# Patient Record
Sex: Female | Born: 1935 | Hispanic: No | Smoking: Former smoker
Health system: Southern US, Community
[De-identification: ages and names within clinical notes are randomized; demographics above are authoritative.]

## PROBLEM LIST (undated history)

## (undated) DIAGNOSIS — I219 Acute myocardial infarction, unspecified: Secondary | ICD-10-CM

## (undated) DIAGNOSIS — E119 Type 2 diabetes mellitus without complications: Secondary | ICD-10-CM

## (undated) DIAGNOSIS — I200 Unstable angina: Secondary | ICD-10-CM

## (undated) DIAGNOSIS — I10 Essential (primary) hypertension: Secondary | ICD-10-CM

## (undated) HISTORY — PX: CAROTID STENT INSERTION: SHX5766

---

## 1997-11-28 ENCOUNTER — Other Ambulatory Visit: Admission: RE | Admit: 1997-11-28 | Discharge: 1997-11-28 | Payer: Self-pay | Admitting: Family Medicine

## 1999-01-09 ENCOUNTER — Other Ambulatory Visit: Admission: RE | Admit: 1999-01-09 | Discharge: 1999-01-09 | Payer: Self-pay | Admitting: Family Medicine

## 2001-10-31 ENCOUNTER — Other Ambulatory Visit: Admission: RE | Admit: 2001-10-31 | Discharge: 2001-10-31 | Payer: Self-pay | Admitting: Family Medicine

## 2006-03-26 ENCOUNTER — Other Ambulatory Visit: Admission: RE | Admit: 2006-03-26 | Discharge: 2006-03-26 | Payer: Self-pay | Admitting: Family Medicine

## 2007-06-23 DIAGNOSIS — I219 Acute myocardial infarction, unspecified: Secondary | ICD-10-CM

## 2007-06-23 HISTORY — DX: Acute myocardial infarction, unspecified: I21.9

## 2008-02-08 ENCOUNTER — Other Ambulatory Visit: Admission: RE | Admit: 2008-02-08 | Discharge: 2008-02-08 | Payer: Self-pay | Admitting: Family Medicine

## 2013-06-01 ENCOUNTER — Ambulatory Visit: Payer: Self-pay

## 2013-12-12 ENCOUNTER — Encounter (HOSPITAL_COMMUNITY)
Admission: EM | Disposition: A | Discharge status: Home / Self Care | Payer: Self-pay | Source: Home / Self Care | Attending: Emergency Medicine

## 2013-12-12 ENCOUNTER — Encounter (HOSPITAL_COMMUNITY): Payer: Self-pay | Admitting: Emergency Medicine

## 2013-12-12 ENCOUNTER — Other Ambulatory Visit: Payer: Self-pay

## 2013-12-12 ENCOUNTER — Emergency Department (HOSPITAL_COMMUNITY): Payer: Medicare Other

## 2013-12-12 ENCOUNTER — Observation Stay (HOSPITAL_COMMUNITY)
Admission: EM | Admit: 2013-12-12 | Discharge: 2013-12-12 | Disposition: A | Discharge status: Home / Self Care | Payer: Medicare Other | Attending: Internal Medicine | Admitting: Internal Medicine

## 2013-12-12 DIAGNOSIS — Z87891 Personal history of nicotine dependence: Secondary | ICD-10-CM | POA: Insufficient documentation

## 2013-12-12 DIAGNOSIS — I200 Unstable angina: Secondary | ICD-10-CM | POA: Diagnosis present

## 2013-12-12 DIAGNOSIS — R079 Chest pain, unspecified: Secondary | ICD-10-CM

## 2013-12-12 DIAGNOSIS — I951 Orthostatic hypotension: Secondary | ICD-10-CM | POA: Diagnosis present

## 2013-12-12 DIAGNOSIS — I2510 Atherosclerotic heart disease of native coronary artery without angina pectoris: Secondary | ICD-10-CM | POA: Diagnosis present

## 2013-12-12 DIAGNOSIS — E785 Hyperlipidemia, unspecified: Secondary | ICD-10-CM | POA: Diagnosis present

## 2013-12-12 DIAGNOSIS — D649 Anemia, unspecified: Secondary | ICD-10-CM

## 2013-12-12 DIAGNOSIS — N390 Urinary tract infection, site not specified: Secondary | ICD-10-CM

## 2013-12-12 DIAGNOSIS — I252 Old myocardial infarction: Secondary | ICD-10-CM | POA: Insufficient documentation

## 2013-12-12 DIAGNOSIS — I25119 Atherosclerotic heart disease of native coronary artery with unspecified angina pectoris: Secondary | ICD-10-CM

## 2013-12-12 DIAGNOSIS — E119 Type 2 diabetes mellitus without complications: Secondary | ICD-10-CM | POA: Insufficient documentation

## 2013-12-12 DIAGNOSIS — Z79899 Other long term (current) drug therapy: Secondary | ICD-10-CM | POA: Insufficient documentation

## 2013-12-12 DIAGNOSIS — R55 Syncope and collapse: Principal | ICD-10-CM

## 2013-12-12 DIAGNOSIS — I10 Essential (primary) hypertension: Secondary | ICD-10-CM | POA: Diagnosis present

## 2013-12-12 DIAGNOSIS — I209 Angina pectoris, unspecified: Secondary | ICD-10-CM

## 2013-12-12 DIAGNOSIS — Z7902 Long term (current) use of antithrombotics/antiplatelets: Secondary | ICD-10-CM | POA: Insufficient documentation

## 2013-12-12 HISTORY — DX: Unstable angina: I20.0

## 2013-12-12 HISTORY — DX: Type 2 diabetes mellitus without complications: E11.9

## 2013-12-12 HISTORY — PX: FRACTIONAL FLOW RESERVE WIRE: SHX5839

## 2013-12-12 HISTORY — PX: LEFT HEART CATHETERIZATION WITH CORONARY ANGIOGRAM: SHX5451

## 2013-12-12 HISTORY — DX: Acute myocardial infarction, unspecified: I21.9

## 2013-12-12 HISTORY — DX: Essential (primary) hypertension: I10

## 2013-12-12 LAB — GLUCOSE, CAPILLARY
GLUCOSE-CAPILLARY: 218 mg/dL — AB (ref 70–99)
Glucose-Capillary: 156 mg/dL — ABNORMAL HIGH (ref 70–99)
Glucose-Capillary: 157 mg/dL — ABNORMAL HIGH (ref 70–99)

## 2013-12-12 LAB — CBC
HCT: 35.6 % — ABNORMAL LOW (ref 36.0–46.0)
HEMATOCRIT: 34.9 % — AB (ref 36.0–46.0)
HEMOGLOBIN: 11 g/dL — AB (ref 12.0–15.0)
Hemoglobin: 11.4 g/dL — ABNORMAL LOW (ref 12.0–15.0)
MCH: 28.2 pg (ref 26.0–34.0)
MCH: 27.8 pg (ref 26.0–34.0)
MCHC: 32 g/dL (ref 30.0–36.0)
MCHC: 31.5 g/dL (ref 30.0–36.0)
MCV: 88.1 fL (ref 78.0–100.0)
MCV: 88.1 fL (ref 78.0–100.0)
Platelets: 273 10*3/uL (ref 150–400)
Platelets: 253 10*3/uL (ref 150–400)
RBC: 4.04 MIL/uL (ref 3.87–5.11)
RBC: 3.96 MIL/uL (ref 3.87–5.11)
RDW: 13.7 % (ref 11.5–15.5)
RDW: 13.6 % (ref 11.5–15.5)
WBC: 5.2 10*3/uL (ref 4.0–10.5)
WBC: 5.3 10*3/uL (ref 4.0–10.5)

## 2013-12-12 LAB — TROPONIN I: Troponin I: <0.3 ng/mL (ref <0.30)

## 2013-12-12 LAB — HEMOGLOBIN A1C
Hgb A1c MFr Bld: 7.8 % — ABNORMAL HIGH (ref <5.7)
Mean Plasma Glucose: 177 mg/dL — ABNORMAL HIGH (ref <117)

## 2013-12-12 LAB — URINALYSIS W MICROSCOPIC (NOT AT ARMC)
BILIRUBIN URINE: NEGATIVE
Glucose, UA: NEGATIVE mg/dL
Hgb urine dipstick: NEGATIVE
KETONES UR: NEGATIVE mg/dL
NITRITE: NEGATIVE
PH: 5.5 (ref 5.0–8.0)
PROTEIN: NEGATIVE mg/dL
Specific Gravity, Urine: 1.016 (ref 1.005–1.030)
Urobilinogen, UA: 0.2 mg/dL (ref 0.0–1.0)

## 2013-12-12 LAB — BASIC METABOLIC PANEL
BUN: 15 mg/dL (ref 6–23)
CALCIUM: 9.7 mg/dL (ref 8.4–10.5)
CO2: 24 mEq/L (ref 19–32)
Chloride: 105 mEq/L (ref 96–112)
Creatinine, Ser: 0.9 mg/dL (ref 0.50–1.10)
GFR, EST AFRICAN AMERICAN: 70 mL/min — AB (ref >90)
GFR, EST NON AFRICAN AMERICAN: 61 mL/min — AB (ref >90)
Glucose, Bld: 155 mg/dL — ABNORMAL HIGH (ref 70–99)
POTASSIUM: 4.2 meq/L (ref 3.7–5.3)
Sodium: 138 mEq/L (ref 137–147)

## 2013-12-12 LAB — POCT ACTIVATED CLOTTING TIME
Activated Clotting Time: 219 seconds
Activated Clotting Time: 259 seconds

## 2013-12-12 LAB — I-STAT CG4 LACTIC ACID, ED: Lactic Acid, Venous: 1.34 mmol/L (ref 0.5–2.2)

## 2013-12-12 LAB — I-STAT TROPONIN, ED: Troponin i, poc: 0.01 ng/mL (ref 0.00–0.08)

## 2013-12-12 LAB — PROTIME-INR
INR: 1.04 (ref 0.00–1.49)
Prothrombin Time: 13.4 seconds (ref 11.6–15.2)

## 2013-12-12 LAB — DIGOXIN LEVEL: DIGOXIN LVL: 1.1 ng/mL (ref 0.8–2.0)

## 2013-12-12 SURGERY — LEFT HEART CATHETERIZATION WITH CORONARY ANGIOGRAM
Anesthesia: LOCAL

## 2013-12-12 MED ORDER — ROSUVASTATIN CALCIUM 20 MG PO TABS: 20.0000 mg | ORAL_TABLET | Freq: Every day | ORAL | Status: AC

## 2013-12-12 MED ORDER — MIDAZOLAM HCL 2 MG/2ML IJ SOLN
INTRAMUSCULAR | Status: AC
  Filled 2013-12-12: qty 2

## 2013-12-12 MED ORDER — ATORVASTATIN CALCIUM 40 MG PO TABS
40.0000 mg | ORAL_TABLET | Freq: Every day | ORAL | Status: DC
  Filled 2013-12-12: qty 1

## 2013-12-12 MED ORDER — SODIUM CHLORIDE 0.9 % IV BOLUS (SEPSIS)
500.0000 mL | Freq: Once | INTRAVENOUS | Status: AC
  Administered 2013-12-12: 500 mL via INTRAVENOUS

## 2013-12-12 MED ORDER — NITROGLYCERIN 0.4 MG SL SUBL: 0.4000 mg | SUBLINGUAL_TABLET | SUBLINGUAL | Status: DC | PRN

## 2013-12-12 MED ORDER — ISOSORBIDE MONONITRATE ER 30 MG PO TB24: 30.0000 mg | ORAL_TABLET | Freq: Every day | ORAL | Status: DC

## 2013-12-12 MED ORDER — ENOXAPARIN SODIUM 40 MG/0.4ML ~~LOC~~ SOLN
40.0000 mg | SUBCUTANEOUS | Status: DC
Start: 2013-12-12 — End: 2013-12-12
  Filled 2013-12-12 (×2): qty 0.4

## 2013-12-12 MED ORDER — MORPHINE SULFATE 2 MG/ML IJ SOLN: 2.0000 mg | INTRAMUSCULAR | Status: DC | PRN

## 2013-12-12 MED ORDER — ACETAMINOPHEN 325 MG PO TABS: 650.0000 mg | ORAL_TABLET | ORAL | Status: DC | PRN

## 2013-12-12 MED ORDER — ADENOSINE 12 MG/4ML IV SOLN
12.0000 mL | INTRAVENOUS | Status: AC
  Administered 2013-12-12: 36 mg via INTRAVENOUS
  Filled 2013-12-12: qty 12

## 2013-12-12 MED ORDER — SODIUM CHLORIDE 0.9 % IV SOLN
1.0000 mL/kg/h | INTRAVENOUS | Status: AC
  Administered 2013-12-12: 1 mL/kg/h via INTRAVENOUS

## 2013-12-12 MED ORDER — ONDANSETRON HCL 4 MG/2ML IJ SOLN: 4.0000 mg | Freq: Four times a day (QID) | INTRAMUSCULAR | Status: DC | PRN

## 2013-12-12 MED ORDER — CARVEDILOL 25 MG PO TABS
25.0000 mg | ORAL_TABLET | Freq: Two times a day (BID) | ORAL | Status: DC
  Filled 2013-12-12 (×3): qty 1

## 2013-12-12 MED ORDER — SODIUM CHLORIDE 0.45 % IV SOLN
INTRAVENOUS | Status: DC
  Administered 2013-12-12: 1000 mL via INTRAVENOUS

## 2013-12-12 MED ORDER — DIGOXIN 125 MCG PO TABS
0.1250 mg | ORAL_TABLET | Freq: Every day | ORAL | Status: DC
  Administered 2013-12-12: 0.125 mg via ORAL
  Filled 2013-12-12: qty 1

## 2013-12-12 MED ORDER — LIDOCAINE HCL (PF) 1 % IJ SOLN
INTRAMUSCULAR | Status: AC
  Filled 2013-12-12: qty 30

## 2013-12-12 MED ORDER — DEXTROSE 5 % IV SOLN
1.0000 g | INTRAVENOUS | Status: DC
  Administered 2013-12-12: 1 g via INTRAVENOUS
  Filled 2013-12-12: qty 10

## 2013-12-12 MED ORDER — SULFAMETHOXAZOLE-TMP DS 800-160 MG PO TABS: 1.0000 | ORAL_TABLET | Freq: Two times a day (BID) | ORAL | Status: DC

## 2013-12-12 MED ORDER — LEVOTHYROXINE SODIUM 100 MCG PO TABS
100.0000 ug | ORAL_TABLET | Freq: Every day | ORAL | Status: DC
  Administered 2013-12-12: 100 ug via ORAL
  Filled 2013-12-12 (×2): qty 1

## 2013-12-12 MED ORDER — ATORVASTATIN CALCIUM 20 MG PO TABS
20.0000 mg | ORAL_TABLET | Freq: Every day | ORAL | Status: DC
  Filled 2013-12-12: qty 1

## 2013-12-12 MED ORDER — GI COCKTAIL ~~LOC~~: 30.0000 mL | Freq: Four times a day (QID) | ORAL | Status: DC | PRN

## 2013-12-12 MED ORDER — HEPARIN SODIUM (PORCINE) 1000 UNIT/ML IJ SOLN
INTRAMUSCULAR | Status: AC
  Filled 2013-12-12: qty 1

## 2013-12-12 MED ORDER — NITROGLYCERIN 0.2 MG/ML ON CALL CATH LAB
INTRAVENOUS | Status: AC
  Filled 2013-12-12: qty 1

## 2013-12-12 MED ORDER — FENTANYL CITRATE 0.05 MG/ML IJ SOLN
INTRAMUSCULAR | Status: AC
  Filled 2013-12-12: qty 2

## 2013-12-12 MED ORDER — VERAPAMIL HCL 2.5 MG/ML IV SOLN
INTRAVENOUS | Status: AC
  Filled 2013-12-12: qty 2

## 2013-12-12 MED ORDER — INSULIN ASPART 100 UNIT/ML ~~LOC~~ SOLN
0.0000 [IU] | Freq: Four times a day (QID) | SUBCUTANEOUS | Status: DC
  Administered 2013-12-12 (×2): 2 [IU] via SUBCUTANEOUS
  Administered 2013-12-12: 3 [IU] via SUBCUTANEOUS

## 2013-12-12 MED ORDER — ISOSORBIDE MONONITRATE ER 30 MG PO TB24
30.0000 mg | ORAL_TABLET | Freq: Every day | ORAL | Status: DC
  Administered 2013-12-12: 30 mg via ORAL
  Filled 2013-12-12: qty 1

## 2013-12-12 MED ORDER — ASPIRIN 81 MG PO CHEW
81.0000 mg | CHEWABLE_TABLET | ORAL | Status: AC
Start: 2013-12-12 — End: 2013-12-12
  Administered 2013-12-12: 81 mg via ORAL
  Filled 2013-12-12: qty 1

## 2013-12-12 MED ORDER — HEPARIN (PORCINE) IN NACL 2-0.9 UNIT/ML-% IJ SOLN
INTRAMUSCULAR | Status: AC
  Filled 2013-12-12: qty 500

## 2013-12-12 MED ORDER — CLOPIDOGREL BISULFATE 75 MG PO TABS
75.0000 mg | ORAL_TABLET | Freq: Every day | ORAL | Status: DC
Start: 2013-12-12 — End: 2013-12-12
  Administered 2013-12-12: 75 mg via ORAL
  Filled 2013-12-12: qty 1

## 2013-12-12 MED ORDER — HEPARIN (PORCINE) IN NACL 2-0.9 UNIT/ML-% IJ SOLN
INTRAMUSCULAR | Status: AC
  Filled 2013-12-12: qty 1000

## 2013-12-12 MED ORDER — INSULIN ASPART 100 UNIT/ML ~~LOC~~ SOLN: 0.0000 [IU] | Freq: Every day | SUBCUTANEOUS | Status: DC

## 2013-12-12 NOTE — ED Provider Notes (Signed)
CSN: [id number]     Arrival date & time 12/12/13  0049 History   First MD Initiated Contact with Patient 12/12/13 0125     Chief Complaint  Patient presents with  . Chest Pain     (Consider location/radiation/quality/duration/timing/severity/associated sxs/prior Treatment) Patient is a 78 y.o. female presenting with syncope. The history is provided by the patient.  Loss of Consciousness Episode history:  Single Most recent episode:  Today Timing:  Constant Progression:  Resolved Chronicity:  New Context: standing up (patient was standing making a sandwich and became dizzy)   Context: not dehydration, not exertion and not urination   Witnessed: no   Relieved by:  Nothing Worsened by:  Nothing tried Ineffective treatments:  None tried Associated symptoms: chest pain (daily, for which she takes nitroglycerin)   Associated symptoms: no fever, no shortness of breath and no vomiting     Past Medical History  Diagnosis Date  . Hypertension   . Diabetes mellitus without complication   . MI (myocardial infarction)    Past Surgical History  Procedure Laterality Date  . Carotid stent insertion     No family history on file. History  Substance Use Topics  . Smoking status: Former Smoker    Types: Cigarettes    Quit date: 06/23/2007  . Smokeless tobacco: Not on file  . Alcohol Use: No   OB History   Grav Para Term Preterm Abortions TAB SAB Ect Mult Living                 Review of Systems  Constitutional: Negative for fever.  Respiratory: Negative for shortness of breath.   Cardiovascular: Positive for chest pain (daily, for which she takes nitroglycerin) and syncope.  Gastrointestinal: Negative for vomiting.  All other systems reviewed and are negative.     Allergies  Review of patient's allergies indicates no known allergies.  Home Medications   Prior to Admission medications   Medication Sig Start Date End Date Taking? Authorizing Provider  carvedilol (COREG)  25 MG tablet Take 25 mg by mouth 2 (two) times daily with a meal.   Yes Historical Provider, MD  clopidogrel (PLAVIX) 75 MG tablet Take 75 mg by mouth daily with breakfast.   Yes Historical Provider, MD  digoxin (LANOXIN) 0.125 MG tablet Take 0.125 mg by mouth daily.   Yes Historical Provider, MD  glimepiride (AMARYL) 4 MG tablet Take 4 mg by mouth 2 (two) times daily.   Yes Historical Provider, MD  levothyroxine (SYNTHROID, LEVOTHROID) 100 MCG tablet Take 100 mcg by mouth daily before breakfast.   Yes Historical Provider, MD  lisinopril (PRINIVIL,ZESTRIL) 5 MG tablet Take 5 mg by mouth daily.   Yes Historical Provider, MD  metFORMIN (GLUCOPHAGE) 1000 MG tablet Take 1,000 mg by mouth 2 (two) times daily with a meal.   Yes Historical Provider, MD  nitroGLYCERIN (NITROSTAT) 0.4 MG SL tablet Place 0.4 mg under the tongue every 5 (five) minutes as needed for chest pain.   Yes Historical Provider, MD  rosuvastatin (CRESTOR) 5 MG tablet Take 5 mg by mouth daily.   Yes Historical Provider, MD  Vitamin D, Ergocalciferol, (DRISDOL) 50000 UNITS CAPS capsule Take 50,000 Units by mouth every 7 (seven) days. On Sunday   Yes Historical Provider, MD   BP 135/60  Pulse 65  Temp(Src) 97.6 F (36.4 C) (Oral)  Resp 15  SpO2 98% Physical Exam  Nursing note and vitals reviewed. Constitutional: She is oriented to person, place, and time. She appears  well-developed and well-nourished. No distress.  HENT:  Head: Normocephalic and atraumatic.  Eyes: EOM are normal. Pupils are equal, round, and reactive to light.  Neck: Normal range of motion. Neck supple.  Cardiovascular: Normal rate and regular rhythm.  Exam reveals no friction rub.   No murmur heard. Pulmonary/Chest: Effort normal and breath sounds normal. No respiratory distress. She has no wheezes. She has no rales.  Abdominal: Soft. She exhibits no distension. There is no tenderness. There is no rebound.  Musculoskeletal: Normal range of motion. She exhibits  no edema.  Neurological: She is alert and oriented to person, place, and time.  Skin: She is not diaphoretic.    ED Course  Procedures (including critical care time) Labs Review Labs Reviewed  CBC - Abnormal; Notable for the following:    Hemoglobin 11.4 (*)    HCT 35.6 (*)    All other components within normal limits  BASIC METABOLIC PANEL - Abnormal; Notable for the following:    Glucose, Bld 155 (*)    GFR calc non Af Amer 61 (*)    GFR calc Af Amer 70 (*)    All other components within normal limits  DIGOXIN LEVEL  I-STAT TROPOININ, ED  I-STAT CG4 LACTIC ACID, ED    Imaging Review Dg Chest Port 1 View  12/12/2013   CLINICAL DATA:  Chest pain. Shortness of breath. Hypertension. Diabetes.  EXAM: PORTABLE CHEST - 1 VIEW  COMPARISON:  11/18/2011  FINDINGS: Mild cardiomegaly.  Tortuous thoracic aorta.  Thoracic spondylosis.  No edema. The lungs appear clear. The patient is rotated to the Right on today's radiograph, reducing diagnostic sensitivity and specificity.  IMPRESSION: 1. Mild cardiomegaly, but without edema. 2. Thoracic spondylosis.   Electronically Signed   By: Sherryl Barters M.D.   On: 12/12/2013 01:38     EKG Interpretation   Date/Time:  Tuesday December 12 2013 00:57:26 EDT Ventricular Rate:  59 PR Interval:  171 QRS Duration: 121 QT Interval:  398 QTC Calculation: 394 R Axis:   -7 Text Interpretation:  Sinus rhythm Ventricular premature complex Right  bundle branch block Anterior infarct, age indeterminate No prior Confirmed  by Mingo Amber  MD, Shaneka Efaw ([id number]) on 12/12/2013 1:27:19 AM      MDM   Final diagnoses:  Syncope, unspecified syncope type    79 year old female with history of hypertension, diabetes, MI presents with dizziness and syncope. Patient has chronic chest pain takes nitroglycerin every day. Patient took a nitroglycerin today, he came weak, and passed out. Our department initially and blood pressure 80/40, EMS had stable pressure. Patient still  feels weak today. Patient informed me she was dizzy while making a sandwich and then passed out. She did not report any preceding chest pain, shortness of breath. Here vitals are stable. Neurologically intact. Labs are okay. Admitted for her syncope.    Osvaldo Shipper, MD 12/12/13 [phone]

## 2013-12-12 NOTE — Discharge Summary (Signed)
Physician Discharge Summary  Dorothy Pena MCE:[id number] DOB: [birth year] DOA: 12/12/2013  PCP: [name], MD  Admit date: 12/12/2013 Discharge date: 12/12/2013  Discharged at patient's request due to death in the family.   Recommendations for Outpatient Follow-up:  1. Start bactrim for UTI 2. Start imdur and increased atorvastatin 3. Hemoglobin a1c is pending 4. F/u with Dr. Geraldo Pitter cardiology, call his office tomorrow to schedule appointment for further evaluation 5. Encouraged to stay avoid the heat and drink 1.5-2L of water 6. PCP in 2 weeks:  Defer anemia work up to PCP if not already complete  Discharge Diagnoses:  Principal Problem:   Coronary artery disease involving native coronary artery with angina pectoris Active Problems:   Syncope   Chest pain   CAD (coronary artery disease)   Dyslipidemia   Essential hypertension, benign   Orthostatic hypotension   Intermediate coronary syndrome   UTI (urinary tract infection)   Normocytic anemia   Discharge Condition: stable  Diet recommendation: diabetic   Wt Readings from Last 3 Encounters:  12/12/13 72.621 kg (160 lb 1.6 oz)  12/12/13 72.621 kg (160 lb 1.6 oz)    History of present illness:  Dorothy Pena is a 78 y.o. female with Past medical history of hypertension, diabetes mellitus, coronary artery disease status post PCI 2009 at [facility].  Patient presented with complaints of chest pain followed by passing out episode. She mentions she gets chest pain on a daily basis which is sometimes at rest and sometimes on exertion located in substernal area and feels like pressure. Sometimes she also has sharp pain worsening with deep breath. She mentions she has been having this kind of pain since last 2 years. She takes nitroglycerin almost on a daily basis for chest pain.  Today she took a nitroglycerin and then felt dizzy and had an episode of syncope. She found herself on the ground denies any headache or focal  neurological deficit. EMS was called EMS found that her blood pressure initially was 82/40 she was having sweating and was complaining of generalized weakness. At the time of my evaluation patient completely felt at her baseline. Denies any dizziness or lightheadedness chest pain nausea vomiting.   She complains of some burning pain with urination since last one week. Denies any she denies any recent change in her medication and mentions she is compliant with her medications.  She does not think she has ever had a stress test. She had a heart attack which led to a coronary angiography and PCI in 2009 in [facility].  Her cardiologist is in [place]   The patient is coming from home And at her baseline independent for most of her ADL.  Hospital Course:  Chest pain. Cardiology was consulted and she underwent cardiac catheterization on 6/23 was demonstrated normal left main coronary, moderate disease in the proximal to mid LAD which was not significant, FFR with adenosine 0.82, large proximal left circumflex artery with severe stenosis in the proximal to ostial OM1. She has had an occluded right coronary artery with faint right to right and left to right collaterals. Grossly normal left ventricular systolic function. Angioplasty and stent were not attempted to her OM 1 is this would have compromised her circumflex, so cardiology recommended medical management. She should continue Plavix, crestor, beta blocker, ACE inhibitor. Imdur was added for antianginal properties and her beta blocker was already optimized. She may continue nitroglycerin when necessary.   Orthostatic hypotension, may have been secondary to recent  NTG, stress from sister recently dying, and dehydration.  She felt better after IVF in the ER.    Diabetes mellitus.  Hemoglobin A1c pending.  She may continue amaryl and metformin   Dyslipidemia, crestor was increased to 20mg  daily per new statin guidelines for patients with  CAD.    UTI with dysuria and 11-20 WBC. Started ceftriaxone, but patient requested discharge so will give rX for bactrim.    Normocytic anemia, mild and hgb remained stable.  F/u with PCP for further evaluation if not already complete.    Consultants:  Cardiology Procedures:  Cardiac catheterization 6/23 Antibiotics:  Ceftriaxone 6/23 x 1  Discharge Exam: Filed Vitals:   12/12/13 1546  BP: 134/87  Pulse: 70  Temp:   Resp:    Filed Vitals:   12/12/13 1500 12/12/13 1515 12/12/13 1530 12/12/13 1546  BP: 141/70 143/65 135/85 134/87  Pulse: 70 69 61 70  Temp:      TempSrc:      Resp:      Height:      Weight:      SpO2: 97% 98% 97% 97%    General: BF, NAD HEENT:  NCAT, MMM Cardiovascular: RRR, no mrg, 2+ pulses Respiratory:  Faint rales at bilateral bases that clear somewhat with repeat respirations ABD:  NABS, soft, NT/ND MSK:  No LEE, normal tone and bulk  Discharge Instructions      Discharge Instructions   Call MD for:  difficulty breathing, headache or visual disturbances    Complete by:  As directed      Call MD for:  extreme fatigue    Complete by:  As directed      Call MD for:  hives    Complete by:  As directed      Call MD for:  persistant dizziness or light-headedness    Complete by:  As directed      Call MD for:  persistant nausea and vomiting    Complete by:  As directed      Call MD for:  severe uncontrolled pain    Complete by:  As directed      Call MD for:  temperature >100.4    Complete by:  As directed      Diet - low sodium heart healthy    Complete by:  As directed      Diet Carb Modified    Complete by:  As directed      Discharge instructions    Complete by:  As directed   You were hospitalized with a fainting spell and frequent chest pain.  You underwent cardiac catheterization which showed several blockages and narrowings, however, none of these areas was able to safely be stented or opened.  We have tried to adjust your  medications so reduce the frequency with which you get chest pain.  First, we added a new medication called imdur.  Please take this medication every day to prevent chest pain.  The dose may be increased by your cardiologist if you continue to have frequent chest pain.  Second, we increased your crestor to 20mg  daily, a more potent dose to try to fight the blockages that we found today.  Please try to avoid the heat and stay hydrated.  Drink at least 1.5L of water daily.  Call your cardiologist's office tomorrow morning to schedule a follow up appointment for as soon as possible so that any other medication changes can be made.  I have given you a  prescription for bactrim for your urinary tract infection.  Your next dose is due tomorrow.  If you continue to have pain with urination, talk to your primary care doctor.     Increase activity slowly    Complete by:  As directed             Medication List         carvedilol 25 MG tablet  Commonly known as:  COREG  Take 25 mg by mouth 2 (two) times daily with a meal.     clopidogrel 75 MG tablet  Commonly known as:  PLAVIX  Take 75 mg by mouth daily with breakfast.     digoxin 0.125 MG tablet  Commonly known as:  LANOXIN  Take 0.125 mg by mouth daily.     glimepiride 4 MG tablet  Commonly known as:  AMARYL  Take 4 mg by mouth 2 (two) times daily.     isosorbide mononitrate 30 MG 24 hr tablet  Commonly known as:  IMDUR  Take 1 tablet (30 mg total) by mouth daily.     levothyroxine 100 MCG tablet  Commonly known as:  SYNTHROID, LEVOTHROID  Take 100 mcg by mouth daily before breakfast.     lisinopril 5 MG tablet  Commonly known as:  PRINIVIL,ZESTRIL  Take 5 mg by mouth daily.     metFORMIN 1000 MG tablet  Commonly known as:  GLUCOPHAGE  Take 1,000 mg by mouth 2 (two) times daily with a meal.     nitroGLYCERIN 0.4 MG SL tablet  Commonly known as:  NITROSTAT  Place 0.4 mg under the tongue every 5 (five) minutes as needed for chest  pain.     rosuvastatin 20 MG tablet  Commonly known as:  CRESTOR  Take 1 tablet (20 mg total) by mouth daily.     sulfamethoxazole-trimethoprim 800-160 MG per tablet  Commonly known as:  BACTRIM DS  Take 1 tablet by mouth 2 (two) times daily.     Vitamin D (Ergocalciferol) 50000 UNITS Caps capsule  Commonly known as:  DRISDOL  Take 50,000 Units by mouth every 7 (seven) days. On Sunday       Follow-up Information   Follow up with [facility], MD. Schedule an appointment as soon as possible for a visit in 2 weeks.   Specialty:  Family Medicine   Contact information:   Dr. Daiva Eves [address] [place] [address] [phone]       Schedule an appointment as soon as possible for a visit with Jenean Lindau, MD. (please call tomorrow to schedule appointment for as soon as possible)    Specialty:  Cardiology   Contact information:   [address] [place] [address] [phone]       The results of significant diagnostics from this hospitalization (including imaging, microbiology, ancillary and laboratory) are listed below for reference.    Significant Diagnostic Studies: Dg Chest Port 1 View  12/12/2013   CLINICAL DATA:  Chest pain. Shortness of breath. Hypertension. Diabetes.  EXAM: PORTABLE CHEST - 1 VIEW  COMPARISON:  11/18/2011  FINDINGS: Mild cardiomegaly.  Tortuous thoracic aorta.  Thoracic spondylosis.  No edema. The lungs appear clear. The patient is rotated to the Right on today's radiograph, reducing diagnostic sensitivity and specificity.  IMPRESSION: 1. Mild cardiomegaly, but without edema. 2. Thoracic spondylosis.   Electronically Signed   By: Sherryl Barters M.D.   On: 12/12/2013 01:38    Microbiology: No results found for  this or any previous visit (from the past 240 hour(s)).   Labs: Basic Metabolic Panel:  Recent Labs Lab 12/12/13 0104  NA 138  K 4.2  CL 105  CO2 24  GLUCOSE 155*  BUN 15  CREATININE 0.90   CALCIUM 9.7   Liver Function Tests: No results found for this basename: AST, ALT, ALKPHOS, BILITOT, PROT, ALBUMIN,  in the last 168 hours No results found for this basename: LIPASE, AMYLASE,  in the last 168 hours No results found for this basename: AMMONIA,  in the last 168 hours CBC:  Recent Labs Lab 12/12/13 0104 12/12/13 1017  WBC 5.2 5.3  HGB 11.4* 11.0*  HCT 35.6* 34.9*  MCV 88.1 88.1  PLT 273 253   Cardiac Enzymes:  Recent Labs Lab 12/12/13 0815 12/12/13 1017  TROPONINI <0.30 <0.30   BNP: BNP (last 3 results) No results found for this basename: PROBNP,  in the last 8760 hours CBG:  Recent Labs Lab 12/12/13 0743 12/12/13 1134  GLUCAP 156* 157*    Time coordinating discharge: 45 minutes  Signed:  SHORT, MACKENZIE  [facility] 12/12/2013, 3:56 PM

## 2013-12-12 NOTE — Interval H&P Note (Signed)
Cath Lab Visit (complete for each Cath Lab visit)  Clinical Evaluation Leading to the Procedure:   ACS: yes  Non-ACS:    Anginal Classification: CCS IV  Anti-ischemic medical therapy: Maximal Therapy (2 or more classes of medications)  Non-Invasive Test Results: No non-invasive testing performed  Prior CABG: No previous CABG      History and Physical Interval Note:  12/12/2013 12:58 PM  Dorothy Pena  has presented today for surgery, with the diagnosis of cp  The various methods of treatment have been discussed with the patient and family. After consideration of risks, benefits and other options for treatment, the patient has consented to  Procedure(s): LEFT HEART CATHETERIZATION WITH CORONARY ANGIOGRAM (N/A) as a surgical intervention .  The patient's history has been reviewed, patient examined, no change in status, stable for surgery.  I have reviewed the patient's chart and labs.  Questions were answered to the patient's satisfaction.     VARANASI,JAYADEEP S.

## 2013-12-12 NOTE — ED Notes (Signed)
Report attempted 

## 2013-12-12 NOTE — Progress Notes (Signed)
Pt discharged home with daughter with discharge paperwork.  Right radial site level 1, pt vitals stable, pt education complete, and pt has no complaints of pain.

## 2013-12-12 NOTE — H&P (Signed)
[facility] History and Physical  Patient: Dorothy Pena  GLO:[id number]  DOB: 04-Mar-1936  DOS: the patient was seen and examined on 12/12/2013 PCP: [name], MD  Chief Complaint: Chest pain and passing out  HPI: Dorothy Pena is a 78 y.o. female with Past medical history of hypertension, diabetes mellitus, coronary artery disease status post PCI 2009 at [facility]. Patient presented with complaints of chest pain followed by passing out episode. She mentions she gets chest pain on a daily basis which is sometimes at rest and sometimes on exertion located in substernal area and feels like pressure. Sometimes she also has sharp pain worsening with deep breath. She mentions she has been having this kind of pain since last 2 years. She takes nitroglycerin almost on a daily basis for chest pain. Today she took a nitroglycerin and then felt dizzy and had an episode of syncope. She found herself on the ground denies any headache or focal neurological deficit. EMS was called EMS found that her blood pressure initially was 82/40 she was having sweating and was complaining of generalized weakness. At the time of my evaluation patient completely felt at her baseline. Denies any dizziness or lightheadedness chest pain nausea vomiting. She complains of some burning pain with urination since last one week. Denies any she denies any recent change in her medication and mentions she is compliant with her medications. She does not think she has ever had a stress test. She had a heart attack which led to a coronary angiography and PCI in 2009 in [facility]. Her cardiologist is in [place]  The patient is coming from home And at her baseline independent for most of her ADL.  Review of Systems: as mentioned in the history of present illness.  A Comprehensive review of the other systems is negative.  Past Medical History  Diagnosis Date  . Hypertension   . Diabetes mellitus  without complication   . MI (myocardial infarction) 2009    1 stent   Past Surgical History  Procedure Laterality Date  . Carotid stent insertion     Social History:  reports that she quit smoking about 6 years ago. Her smoking use included Cigarettes. She smoked 0.00 packs per day. She does not have any smokeless tobacco history on file. She reports that she does not drink alcohol or use illicit drugs.  No Known Allergies  History reviewed. No pertinent family history.  Prior to Admission medications   Medication Sig Start Date End Date Taking? Authorizing Prerana Strayer  carvedilol (COREG) 25 MG tablet Take 25 mg by mouth 2 (two) times daily with a meal.   Yes Historical Shykeem Resurreccion, MD  clopidogrel (PLAVIX) 75 MG tablet Take 75 mg by mouth daily with breakfast.   Yes Historical Alexis Reber, MD  digoxin (LANOXIN) 0.125 MG tablet Take 0.125 mg by mouth daily.   Yes Historical Cristino Degroff, MD  glimepiride (AMARYL) 4 MG tablet Take 4 mg by mouth 2 (two) times daily.   Yes Historical Damarri Rampy, MD  levothyroxine (SYNTHROID, LEVOTHROID) 100 MCG tablet Take 100 mcg by mouth daily before breakfast.   Yes Historical Loris Winrow, MD  lisinopril (PRINIVIL,ZESTRIL) 5 MG tablet Take 5 mg by mouth daily.   Yes Historical Thornton Dohrmann, MD  metFORMIN (GLUCOPHAGE) 1000 MG tablet Take 1,000 mg by mouth 2 (two) times daily with a meal.   Yes Historical Saylor Sheckler, MD  nitroGLYCERIN (NITROSTAT) 0.4 MG SL tablet Place 0.4 mg under the tongue every 5 (five) minutes as needed for  chest pain.   Yes Historical Anzal Bartnick, MD  rosuvastatin (CRESTOR) 5 MG tablet Take 5 mg by mouth daily.   Yes Historical Allesandra Huebsch, MD  Vitamin D, Ergocalciferol, (DRISDOL) 50000 UNITS CAPS capsule Take 50,000 Units by mouth every 7 (seven) days. On Sunday   Yes Historical Tanuj Mullens, MD    Physical Exam: Filed Vitals:   12/12/13 0330 12/12/13 0400 12/12/13 0430 12/12/13 0523  BP: 106/92 122/57 133/51 128/77  Pulse: 71 65 74 68  Temp:    98.4 F (36.9 C)   TempSrc:    Oral  Resp: 18 18 20 16   Height:    5\' 6"  (1.676 m)  Weight:    72.621 kg (160 lb 1.6 oz)  SpO2: 98% 95% 97% 98%    General: Alert, Awake and Oriented to Time, Place and Person. Appear in mild distress Eyes: PERRL ENT: Oral Mucosa clear moist. Neck: no JVD Cardiovascular: S1 and S2 Present, no Murmur, Peripheral Pulses Present Respiratory: Bilateral Air entry equal and Decreased, Clear to Auscultation,  no Crackles,no wheezes Abdomen: Bowel Sound Present, Soft and Non tender Skin: no Rash Extremities: no Pedal edema, no calf tenderness Neurologic: Grossly no focal neuro deficit.  Labs on Admission:  CBC:  Recent Labs Lab 12/12/13 0104  WBC 5.2  HGB 11.4*  HCT 35.6*  MCV 88.1  PLT 273    CMP     Component Value Date/Time   NA 138 12/12/2013 0104   K 4.2 12/12/2013 0104   CL 105 12/12/2013 0104   CO2 24 12/12/2013 0104   GLUCOSE 155* 12/12/2013 0104   BUN 15 12/12/2013 0104   CREATININE 0.90 12/12/2013 0104   CALCIUM 9.7 12/12/2013 0104   GFRNONAA 61* 12/12/2013 0104   GFRAA 70* 12/12/2013 0104    No results found for this basename: LIPASE, AMYLASE,  in the last 168 hours No results found for this basename: AMMONIA,  in the last 168 hours  No results found for this basename: CKTOTAL, CKMB, CKMBINDEX, TROPONINI,  in the last 168 hours BNP (last 3 results) No results found for this basename: PROBNP,  in the last 8760 hours  Radiological Exams on Admission: Dg Chest Port 1 View  12/12/2013   CLINICAL DATA:  Chest pain. Shortness of breath. Hypertension. Diabetes.  EXAM: PORTABLE CHEST - 1 VIEW  COMPARISON:  11/18/2011  FINDINGS: Mild cardiomegaly.  Tortuous thoracic aorta.  Thoracic spondylosis.  No edema. The lungs appear clear. The patient is rotated to the Right on today's radiograph, reducing diagnostic sensitivity and specificity.  IMPRESSION: 1. Mild cardiomegaly, but without edema. 2. Thoracic spondylosis.   Electronically Signed   By: Sherryl Barters  M.D.   On: 12/12/2013 01:38  EKG: Independently reviewed. normal sinus rhythm, nonspecific ST and T waves changes, RBBB, occasional PVC noted, unifocal.  Assessment/Plan Principal Problem:   Chest pain Active Problems:   Syncope   CAD (coronary artery disease)   Dyslipidemia   Essential hypertension, benign   Orthostatic hypotension   1. Chest pain Patient presents with complaints of chest pain. At present she is chest pain-free. Her EKG does not show any evidence of acute ischemia she has right bundle branch block and nonspecific ST-T wave depression. No prior EKG to compare with. Initial troponin negative. With that the patient will be admitted to the hospital. We will continue to follow serial troponin. She will be kept n.p.o. for possible procedures. Echocardiogram in the morning. May require cardiology consultation. Continue Plavix and Lipitor.  2. Syncope Orthostatic  hypotension Patient has significant drop in orthostatic as per ED physician from 1:30 millimeter of MG 200 limit her of IJ. At present I will continue her on Coreg but hold her lisinopril and place her on nitroglycerin as needed. Monitor her on telemetry. Limited echocardiogram for syncope.  3. Diabetes mellitus. Holding Amaryl and metformin and placing her on sliding scale.  4. Dyslipidemia Continue Lipitor.  5. Burning urination Check UA  DVT Prophylaxis: subcutaneous Heparin Nutrition: N.p.o.  Code Status: Full  Disposition: Admitted to observation in telemetry unit.  Author: Berle Mull, MD [facility] Pager: [phone] 12/12/2013, 7:25 AM    If 7PM-7AM, please contact night-coverage [email or web address] Password [email or web address]

## 2013-12-12 NOTE — CV Procedure (Addendum)
PROCEDURE:  Left heart catheterization with selective coronary angiography, left ventriculogram.  FFR LAD.  INDICATIONS:  Unstable angina  The risks, benefits, and details of the procedure were explained to the patient.  The patient verbalized understanding and wanted to proceed.  Informed written consent was obtained.  PROCEDURE TECHNIQUE:  After Xylocaine anesthesia a 46F slender sheath was placed in the right radial artery with a single anterior needle wall stick.  IV heparin was given. There is some difficulty getting catheters up through the tortuosity in the right subclavian.  Right coronary angiography was done using a Judkins R4 guide catheter.  Left coronary angiography was done using an EBU 3.0 guide catheter.  FFR was performed with IV heparin as anticoagulation. ACT was used to check that the anticoagulation is therapeutic. A pigtail catheter would not traverse into the descending aorta through the tortuosity in the right subclavian. Left ventriculography was done using an MPA 2 with hand injection.  A TR band was used for hemostasis.   CONTRAST:  Total of 85 cc.  COMPLICATIONS:  None.    HEMODYNAMICS:  Aortic pressure was 148/54; LV pressure was 152/9; LVEDP 24.  There was no gradient between the left ventricle and aorta.    ANGIOGRAPHIC DATA:   The left main coronary artery is widely patent.  The left anterior descending artery is a large vessel. In the proximal vessel before the prior stent, there is a moderate eccentric stenosis. In the mid vessel, there is mild to moderate calcific atherosclerotic disease. There is diffuse proximal disease in a small diagonal vessel. The second diagonal is small and has a 90% mid vessel stenosis. This vessel is too small for intervention. The remainder of the mid to distal LAD has mild to moderate calcific atherosclerosis. There are collaterals to the distal right system noted from the LAD and circumflex..  The left circumflex artery is a  large vessel. At the origin of the first obtuse marginal, there is significant tortuosity. The first obtuse marginal is a large vessel. There appears to be a calcific, 80% stenosis. This particular segment of the OM1 is very tortuous. The remainder of the circumflex has mild to moderate disease just after the origin of the OM1. The OM 2 is small but patent.  The right coronary artery is not well visualized. The very proximal vessel is large and patent. The vessel is occluded proximally. There is an RV marginal branch which delivers some ipsilateral collaterals. The very distal portion of the RCA is supplied by left to right collaterals.  LEFT VENTRICULOGRAM:  Left ventricular angiogram was done in the 30 RAO projection with hand injection. Overall systolic function appears grossly normal.  LVEDP was 24 mmHg.  INTERVENTIONAL NARRATIVE: The EBU 3.0 guiding catheters used to engage the left main. The pressure wire was placed in the left main and normalize. Stent placed across the area disease in the LAD. Resting FFR was 0.92. After hyperemia with adenosine, FFR was 0.82. This was not significant.  IMPRESSIONS:  1. Normal left main coronary artery. 2. Moderate disease in the proximal to mid left anterior descending artery. FFR with adenosine was 0.82. The disease was not significant. 3. Large proximal left circumflex artery. Severe stenosis in the proximal to ostial OM1. This is not a good target for intervention due to the large circumflex that would have to be compromised, calcium and significant tortuosity.  Would recommend medical therapy with antianginals.  If intervention required, would have to consider  two stent strategy. 4. Occluded right coronary artery.  Faint right to right and left to right collaterals. 5.  Grossly normal left ventricular systolic function.  LVEDP 24 mmHg.    6. In the future if cardiac cath is required, I would recommend not using the right radial artery due to significant  tortuosity in the right subclavian making catheter exchanges difficult.  RECOMMENDATION:  Continue medical therapy for coronary disease. She does have significant disease with an occluded RCA and high-grade stenosis in obtuse marginal. LAD was negative by FFR.  Continue workup for syncope. She'll followup with her cardiologist in [place], Dr. Geraldo Pitter.

## 2013-12-12 NOTE — ED Notes (Signed)
Pt states she was having some CP at home, took one SL nitro, got weak and had a syncopal episode in which she thinks she passed out.  Pt states she was weak and diapharetic on scene.  Fire department on scene got a BP of 82/40, EMS states BP of 120/60.  Pt now pain free but states she just doesn't feel like herself

## 2013-12-12 NOTE — Progress Notes (Signed)
Utilization review completed.  

## 2013-12-12 NOTE — Consult Note (Signed)
CARDIOLOGY CONSULT NOTE       Patient ID: Dorothy Pena MRN: [id number] DOB/AGE: Jan 22, 1936 78 y.o.  Admit date: 12/12/2013 Referring Physician:  Posey Pena Primary Physician: Dorothy Sake, MD Primary Cardiologist:  Dorothy Pena Reason for Consultation:  Chest Pain  Principal Problem:   Chest pain Active Problems:   Syncope   CAD (coronary artery disease)   Dyslipidemia   Essential hypertension, benign   Orthostatic hypotension   HPI:   78 yo followed in [place] by Dr Dorothy Pena.  History of PCI in 2009.  Indicates possibly having cath few years ago and had a "blockage" they couldn't get to.  Taking nitro a lot.  At least 3-4 x / week  Yesterday after fixing a sandwich had SSCP  Took nitro and felt lightheaded.  Pain recurred and went to ER.  In ER note made of being postural.  No palpitations, dyspnea , fever.  Lives with daughter Dorothy Pena  Does all ADL's  Indicates taking a lot of nitro for the last 2 years.  No recent stress test.  Denies GERD/PUD or other associated GI symptoms  Has r/o with no acute ECG changes CRF HTN, DM Increased lipids  Was on plavix chronically  CXR ok only mild CE  ROS All other systems reviewed and negative except as noted above  Past Medical History  Diagnosis Date  . Hypertension   . Diabetes mellitus without complication   . MI (myocardial infarction) 2009    1 stent    History reviewed. No pertinent family history.  History   Social History  . Marital Status: Unknown    Spouse Name: N/A    Number of Children: N/A  . Years of Education: N/A   Occupational History  . Not on file.   Social History Main Topics  . Smoking status: Former Smoker    Types: Cigarettes    Quit date: 06/23/2007  . Smokeless tobacco: Not on file  . Alcohol Use: No  . Drug Use: No  . Sexual Activity: Not on file   Other Topics Concern  . Not on file   Social History Narrative  . No narrative on file    Past Surgical History  Procedure Laterality Date    . Carotid stent insertion       . atorvastatin  20 mg Oral q1800  . carvedilol  25 mg Oral BID WC  . clopidogrel  75 mg Oral Q breakfast  . digoxin  0.125 mg Oral Daily  . enoxaparin (LOVENOX) injection  40 mg Subcutaneous Q24H  . insulin aspart  0-5 Units Subcutaneous QHS  . insulin aspart  0-9 Units Subcutaneous Q6H  . levothyroxine  100 mcg Oral QAC breakfast      Physical Exam: Blood pressure 128/77, pulse 68, temperature 98.4 F (36.9 C), temperature source Oral, resp. rate 16, height 5\' 6"  (1.676 m), weight 160 lb 1.6 oz (72.621 kg), SpO2 98.00%.   Affect appropriate Healthy:  appears stated age 33: normal Neck supple with no adenopathy JVP normal no bruits no thyromegaly Lungs clear with no wheezing and good diaphragmatic motion Heart:  S1/S2 SEM murmur, no rub, gallop or click PMI normal Abdomen: benighn, BS positve, no tenderness, no AAA no bruit.  No HSM or HJR Distal pulses intact with no bruits No edema Neuro non-focal Skin warm and dry No muscular weakness   Labs:   Lab Results  Component Value Date   WBC 5.2 12/12/2013   HGB 11.4* 12/12/2013   HCT 35.6*  12/12/2013   MCV 88.1 12/12/2013   PLT 273 12/12/2013    Recent Labs Lab 12/12/13 0104  NA 138  K 4.2  CL 105  CO2 24  BUN 15  CREATININE 0.90  CALCIUM 9.7  GLUCOSE 155*    Radiology: Dg Chest Port 1 View  12/12/2013   CLINICAL DATA:  Chest pain. Shortness of breath. Hypertension. Diabetes.  EXAM: PORTABLE CHEST - 1 VIEW  COMPARISON:  11/18/2011  FINDINGS: Mild cardiomegaly.  Tortuous thoracic aorta.  Thoracic spondylosis.  No edema. The lungs appear clear. The patient is rotated to the Right on today's radiograph, reducing diagnostic sensitivity and specificity.  IMPRESSION: 1. Mild cardiomegaly, but without edema. 2. Thoracic spondylosis.   Electronically Signed   By: Dorothy Pena M.D.   On: 12/12/2013 01:38    EKG:  SR RBBB PVC no old tracing    ASSESSMENT AND PLAN:  Chest Pain:   Elderly diabetic with known CAD.  Lots of chest pain.  R/O  Discussed options  Favor cath today Discussed with her daughter Dorothy Pena who she lives with on the phone and they both agree Cath lab called Orders written I think it is important to clarify the extent of CAD given how much nitro she takes  Continue aspirin and plavix  Pre-syncope:  Seems to be releated to nitro  Documented in ER  Hydrate  No other signs of autonomic dysfunction but she is a diabetic DM  Hold amaryl and metformin for cath this am  Signed: Jenkins Pena 12/12/2013, 8:15 AM

## 2013-12-14 LAB — URINE CULTURE

## 2014-03-22 DIAGNOSIS — I340 Nonrheumatic mitral (valve) insufficiency: Secondary | ICD-10-CM | POA: Insufficient documentation

## 2014-05-31 ENCOUNTER — Encounter (HOSPITAL_COMMUNITY): Payer: Self-pay | Admitting: Interventional Cardiology

## 2015-03-27 IMAGING — CR DG CHEST 1V PORT
1 series · 1 of 1 positions shown · non-contrast
Comparison: 11/18/2011

CLINICAL DATA: Chest pain. Shortness of breath. Hypertension.
Diabetes.

EXAM:
PORTABLE CHEST - 1 VIEW

[AP]
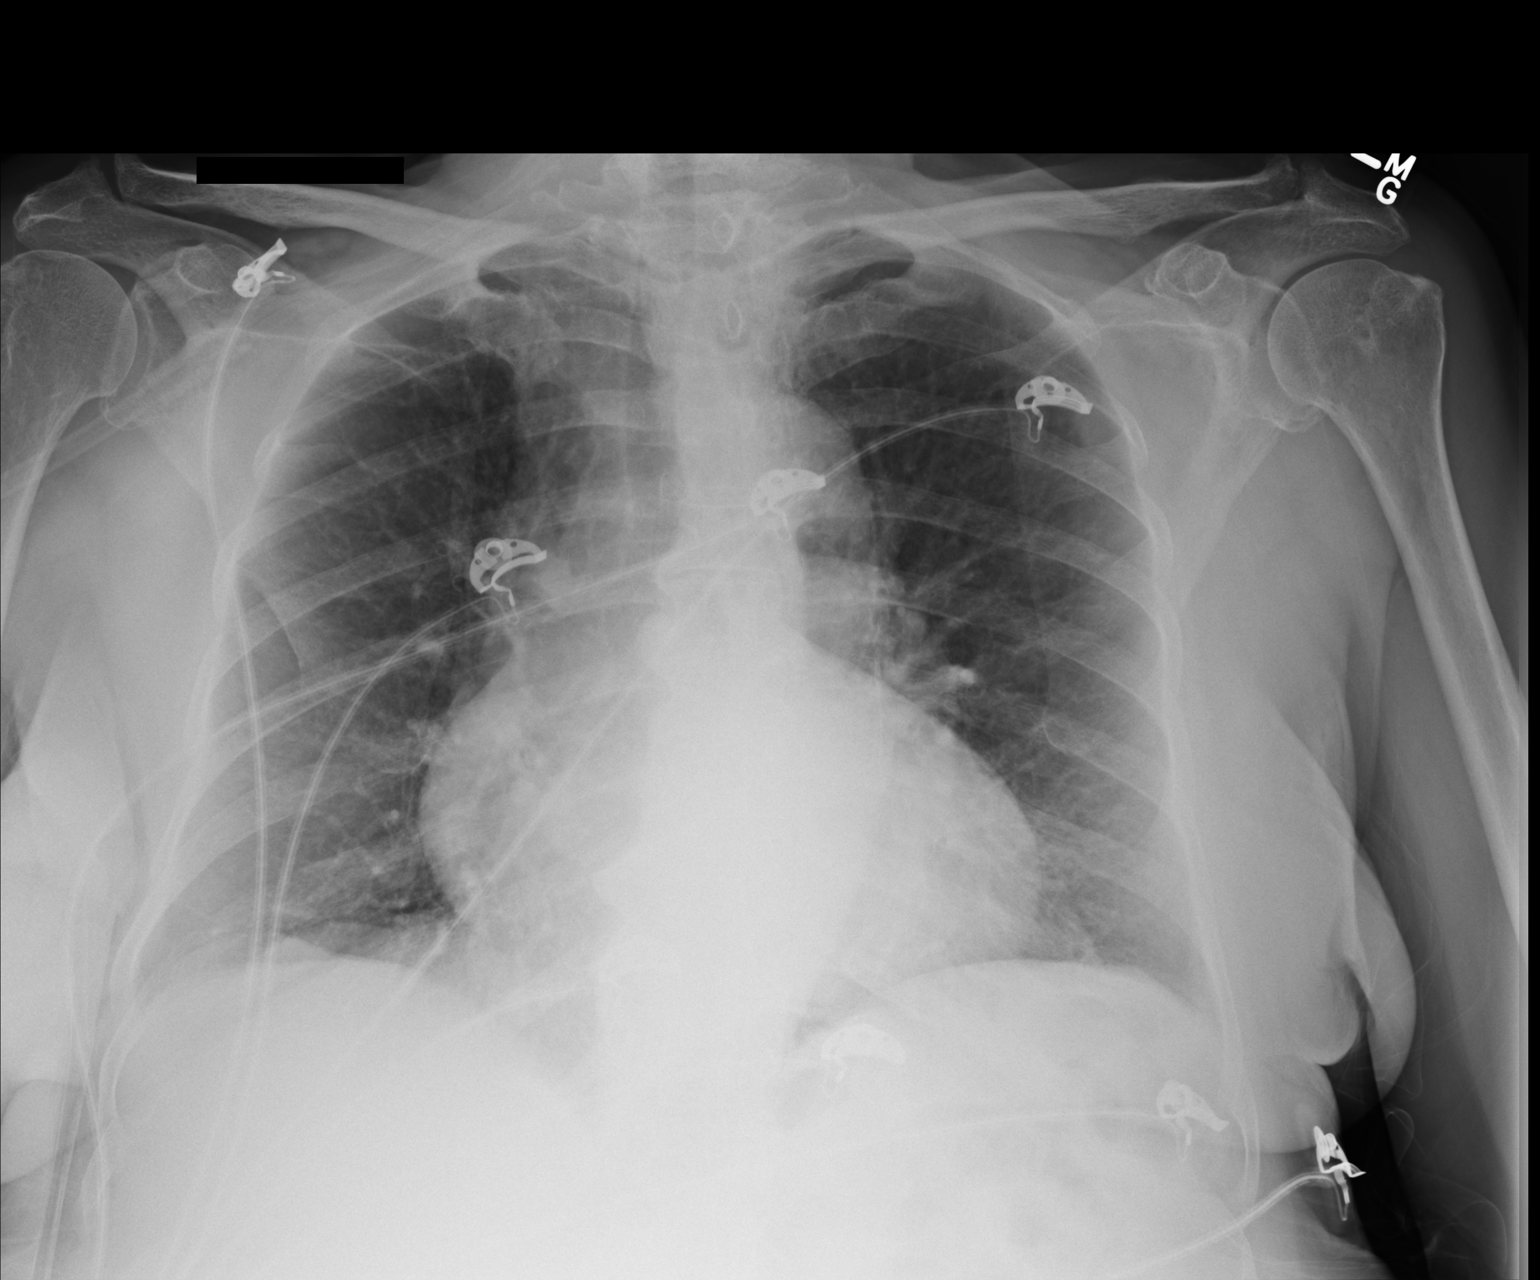

[1 of 1 positions shown; findings below may reference images not displayed]

FINDINGS: Mild cardiomegaly.  Tortuous thoracic aorta.  Thoracic spondylosis.

No edema. The lungs appear clear. The patient is rotated to the
Right on today's radiograph, reducing diagnostic sensitivity and
specificity.
IMPRESSION: 1. Mild cardiomegaly, but without edema.
2. Thoracic spondylosis.

## 2015-07-24 ENCOUNTER — Ambulatory Visit (INDEPENDENT_AMBULATORY_CARE_PROVIDER_SITE_OTHER): Payer: Medicare Other | Admitting: Sports Medicine

## 2015-07-24 ENCOUNTER — Encounter: Payer: Self-pay | Admitting: Sports Medicine

## 2015-07-24 DIAGNOSIS — M79672 Pain in left foot: Secondary | ICD-10-CM | POA: Diagnosis not present

## 2015-07-24 DIAGNOSIS — M79671 Pain in right foot: Secondary | ICD-10-CM

## 2015-07-24 DIAGNOSIS — B351 Tinea unguium: Secondary | ICD-10-CM

## 2015-07-24 DIAGNOSIS — E1140 Type 2 diabetes mellitus with diabetic neuropathy, unspecified: Secondary | ICD-10-CM

## 2015-07-24 DIAGNOSIS — L84 Corns and callosities: Secondary | ICD-10-CM | POA: Diagnosis not present

## 2015-07-24 NOTE — Patient Instructions (Signed)
Diabetes and Foot Care Diabetes may cause you to have problems because of poor blood supply (circulation) to your feet and legs. This may cause the skin on your feet to become thinner, break easier, and heal more slowly. Your skin may become dry, and the skin may peel and crack. You may also have nerve damage in your legs and feet causing decreased feeling in them. You may not notice minor injuries to your feet that could lead to infections or more serious problems. Taking care of your feet is one of the most important things you can do for yourself.  HOME CARE INSTRUCTIONS  Wear shoes at all times, even in the house. Do not go barefoot. Bare feet are easily injured.  Check your feet daily for blisters, cuts, and redness. If you cannot see the bottom of your feet, use a mirror or ask someone for help.  Wash your feet with warm water (do not use hot water) and mild soap. Then pat your feet and the areas between your toes until they are completely dry. Do not soak your feet as this can dry your skin.  Apply a moisturizing lotion or petroleum jelly (that does not contain alcohol and is unscented) to the skin on your feet and to dry, brittle toenails. Do not apply lotion between your toes.  Trim your toenails straight across. Do not dig under them or around the cuticle. File the edges of your nails with an emery board or nail file.  Do not cut corns or calluses or try to remove them with medicine.  Wear clean socks or stockings every day. Make sure they are not too tight. Do not wear knee-high stockings since they may decrease blood flow to your legs.  Wear shoes that fit properly and have enough cushioning. To break in new shoes, wear them for just a few hours a day. This prevents you from injuring your feet. Always look in your shoes before you put them on to be sure there are no objects inside.  Do not cross your legs. This may decrease the blood flow to your feet.  If you find a minor scrape,  cut, or break in the skin on your feet, keep it and the skin around it clean and dry. These areas may be cleansed with mild soap and water. Do not cleanse the area with peroxide, alcohol, or iodine.  When you remove an adhesive bandage, be sure not to damage the skin around it.  If you have a wound, look at it several times a day to make sure it is healing.  Do not use heating pads or hot water bottles. They may burn your skin. If you have lost feeling in your feet or legs, you may not know it is happening until it is too late.  Make sure your health care provider performs a complete foot exam at least annually or more often if you have foot problems. Report any cuts, sores, or bruises to your health care provider immediately. SEEK MEDICAL CARE IF:   You have an injury that is not healing.  You have cuts or breaks in the skin.  You have an ingrown nail.  You notice redness on your legs or feet.  You feel burning or tingling in your legs or feet.  You have pain or cramps in your legs and feet.  Your legs or feet are numb.  Your feet always feel cold. SEEK IMMEDIATE MEDICAL CARE IF:   There is increasing redness,   swelling, or pain in or around a wound.  There is a red line that goes up your leg.  Pus is coming from a wound.  You develop a fever or as directed by your health care provider.  You notice a bad smell coming from an ulcer or wound.   This information is not intended to replace advice given to you by your health care provider. Make sure you discuss any questions you have with your health care provider.   Document Released: 06/05/2000 Document Revised: 02/08/2013 Document Reviewed: 11/15/2012 Elsevier Interactive Patient Education 2016 [employer]  

## 2015-07-24 NOTE — Progress Notes (Signed)
Patient ID: Dorothy Pena, female   DOB: 28-Sep-1935, 80 y.o.   MRN: [id number] Subjective: Dorothy Pena is a 80 y.o. female patient with history of type 2 diabetes who presents to office today complaining of  Callus and long, painful nails  while ambulating in shoes; unable to trim. Patient states that the glucose reading this morning was not recorded, but has a history of high blood sugar, which is currently controlled with oral diabetic medication. Patient denies any new changes in medication or new problems. Patient denies any new cramping, numbness, burning or tingling in the legs.  Patient Active Problem List   Diagnosis Date Noted  . Syncope 12/12/2013  . Chest pain 12/12/2013  . CAD (coronary artery disease) 12/12/2013  . Dyslipidemia 12/12/2013  . Essential hypertension, benign 12/12/2013  . Orthostatic hypotension 12/12/2013  . Coronary artery disease involving native coronary artery with angina pectoris ([place]) 12/12/2013  . UTI (urinary tract infection) 12/12/2013  . Normocytic anemia 12/12/2013  . Intermediate coronary syndrome [facility] - )    Current Outpatient Prescriptions on File Prior to Visit  Medication Sig Dispense Refill  . carvedilol (COREG) 25 MG tablet Take 25 mg by mouth 2 (two) times daily with a meal.    . clopidogrel (PLAVIX) 75 MG tablet Take 75 mg by mouth daily with breakfast.    . digoxin (LANOXIN) 0.125 MG tablet Take 0.125 mg by mouth daily.    [name] Kitchen glimepiride (AMARYL) 4 MG tablet Take 4 mg by mouth 2 (two) times daily.    . isosorbide mononitrate (IMDUR) 30 MG 24 hr tablet Take 1 tablet (30 mg total) by mouth daily. 30 tablet 0  . levothyroxine (SYNTHROID, LEVOTHROID) 100 MCG tablet Take 100 mcg by mouth daily before breakfast.    . lisinopril (PRINIVIL,ZESTRIL) 5 MG tablet Take 5 mg by mouth daily.    . metFORMIN (GLUCOPHAGE) 1000 MG tablet Take 1,000 mg by mouth 2 (two) times daily with a meal.    . nitroGLYCERIN (NITROSTAT) 0.4 MG SL tablet Place 0.4 mg under the  tongue every 5 (five) minutes as needed for chest pain.    . rosuvastatin (CRESTOR) 20 MG tablet Take 1 tablet (20 mg total) by mouth daily. 30 tablet 0  . sulfamethoxazole-trimethoprim (BACTRIM DS) 800-160 MG per tablet Take 1 tablet by mouth 2 (two) times daily. 6 tablet 0  . Vitamin D, Ergocalciferol, (DRISDOL) 50000 UNITS CAPS capsule Take 50,000 Units by mouth every 7 (seven) days. On Sunday     No current facility-administered medications on file prior to visit.   No Known Allergies   Objective: General: Patient is awake, alert, and oriented x 3 and in no acute distress.  Integument: Skin is warm, dry and supple bilateral. Nails are tender, long, thickened and  dystrophic with subungual debris, consistent with onychomycosis, 1-5 bilateral. No signs of infection. No open lesions. + Callus present on right foot sub met 4.  Remaining integument unremarkable.  Vasculature:  Dorsalis Pedis pulse 2/4 bilateral. Posterior Tibial pulse  1/4 bilateral.  Capillary fill time <3 sec 1-5 bilateral. Scant hair growth to the level of the digits. Temperature gradient within normal limits.+ varicosities present bilateral. No edema present bilateral.   Neurology: The patient has intact sensation measured with a 5.07/10g Semmes Weinstein Monofilament at all pedal sites bilateral . Vibratory sensation diminished bilateral with tuning fork. No Babinski sign present bilateral.   Musculoskeletal: Asymptomatic hammertoe deformities noted bilateral. Muscular strength 5/5 in all lower extremity muscular groups bilateral without pain on  range of motion . No tenderness with calf compression bilateral.  Assessment and Plan: Problem List Items Addressed This Visit    None    Visit Diagnoses    Dermatophytosis of nail    -  Primary    Callus of foot        R sub met 4    Foot pain, bilateral        Type 2 diabetes mellitus with diabetic neuropathy, without long-term current use of insulin ([place])           -Examined patient. -Discussed and educated patient on diabetic foot care, especially with  regards to the vascular, neurological and musculoskeletal systems.  -Stressed the importance of good glycemic control and the detriment of not  controlling glucose levels in relation to the foot. -Mechanically debrided callus x 1 using sterile chisel blade and all nails 1-5 bilateral using sterile nail nipper and filed with dremel without incident  -Answered all patient questions -Patient to return in 3 months for at risk foot care -Patient advised to call the office if any problems or questions arise in the meantime.  [name], DPM

## 2015-10-23 ENCOUNTER — Encounter: Payer: Self-pay | Admitting: Sports Medicine

## 2015-10-23 ENCOUNTER — Ambulatory Visit (INDEPENDENT_AMBULATORY_CARE_PROVIDER_SITE_OTHER): Payer: Medicaid Other | Admitting: Sports Medicine

## 2015-10-23 DIAGNOSIS — M79671 Pain in right foot: Secondary | ICD-10-CM

## 2015-10-23 DIAGNOSIS — E1140 Type 2 diabetes mellitus with diabetic neuropathy, unspecified: Secondary | ICD-10-CM

## 2015-10-23 DIAGNOSIS — L84 Corns and callosities: Secondary | ICD-10-CM | POA: Diagnosis not present

## 2015-10-23 DIAGNOSIS — M79672 Pain in left foot: Secondary | ICD-10-CM

## 2015-10-23 DIAGNOSIS — B351 Tinea unguium: Secondary | ICD-10-CM | POA: Diagnosis not present

## 2015-10-23 NOTE — Progress Notes (Signed)
Patient ID: Dorothy Pena, female   DOB: January 24, 1936, 80 y.o.   MRN: [id number] Subjective: Dorothy Pena is a 80 y.o. female patient with history of type 2 diabetes who returns to office today complaining of Callus and long, painful nails  while ambulating in shoes; unable to trim. Patient states that the glucose reading this morning was not recorded. Patient denies any new changes in medication or new problems. Patient denies any new cramping, numbness, burning or tingling in the legs.  Patient Active Problem List   Diagnosis Date Noted  . Non-rheumatic mitral regurgitation 03/22/2014  . Syncope 12/12/2013  . Chest pain 12/12/2013  . CAD (coronary artery disease) 12/12/2013  . Dyslipidemia 12/12/2013  . Essential hypertension, benign 12/12/2013  . Orthostatic hypotension 12/12/2013  . Coronary artery disease involving native coronary artery with angina pectoris ([place]) 12/12/2013  . UTI (urinary tract infection) 12/12/2013  . Normocytic anemia 12/12/2013  . Intermediate coronary syndrome [facility])    Current Outpatient Prescriptions on File Prior to Visit  Medication Sig Dispense Refill  . carvedilol (COREG) 25 MG tablet Take 25 mg by mouth 2 (two) times daily with a meal.    . clopidogrel (PLAVIX) 75 MG tablet Take 75 mg by mouth daily with breakfast.    . digoxin (LANOXIN) 0.125 MG tablet Take 0.125 mg by mouth daily.    [name] Kitchen glimepiride (AMARYL) 4 MG tablet Take 4 mg by mouth 2 (two) times daily.    . isosorbide mononitrate (IMDUR) 30 MG 24 hr tablet Take 1 tablet (30 mg total) by mouth daily. 30 tablet 0  . levothyroxine (SYNTHROID, LEVOTHROID) 100 MCG tablet Take 100 mcg by mouth daily before breakfast.    . lisinopril (PRINIVIL,ZESTRIL) 5 MG tablet Take 5 mg by mouth daily.    . metFORMIN (GLUCOPHAGE) 1000 MG tablet Take 1,000 mg by mouth 2 (two) times daily with a meal.    . nitroGLYCERIN (NITROSTAT) 0.4 MG SL tablet Place 0.4 mg under the tongue every 5 (five) minutes as needed for chest pain.     . rosuvastatin (CRESTOR) 20 MG tablet Take 1 tablet (20 mg total) by mouth daily. 30 tablet 0  . sulfamethoxazole-trimethoprim (BACTRIM DS) 800-160 MG per tablet Take 1 tablet by mouth 2 (two) times daily. 6 tablet 0  . Vitamin D, Ergocalciferol, (DRISDOL) 50000 UNITS CAPS capsule Take 50,000 Units by mouth every 7 (seven) days. On Sunday     No current facility-administered medications on file prior to visit.   No Known Allergies   Objective: General: Patient is awake, alert, and oriented x 3 and in no acute distress.  Integument: Skin is warm, dry and supple bilateral. Nails are tender, long, thickened and  dystrophic with subungual debris, consistent with onychomycosis, 1-5 bilateral. No signs of infection. No open lesions. + Callus present on right foot sub met 4 and left plantar hallux.  Remaining integument unremarkable.  Vasculature:  Dorsalis Pedis pulse 2/4 bilateral. Posterior Tibial pulse  1/4 bilateral.  Capillary fill time <3 sec 1-5 bilateral. Scant hair growth to the level of the digits. Temperature gradient within normal limits.+ varicosities present bilateral. No edema present bilateral.   Neurology: The patient has intact sensation measured with a 5.07/10g Semmes Weinstein Monofilament at all pedal sites bilateral . Vibratory sensation diminished bilateral with tuning fork. No Babinski sign present bilateral.   Musculoskeletal: Asymptomatic hammertoe deformities noted bilateral and left hallux extensus. Muscular strength 5/5 in all lower extremity muscular groups bilateral without pain on range of motion .  No tenderness with calf compression bilateral.  Assessment and Plan: Problem List Items Addressed This Visit    None    Visit Diagnoses    Dermatophytosis of nail    -  Primary    Callus of foot        Foot pain, bilateral        Type 2 diabetes mellitus with diabetic neuropathy, without long-term current use of insulin ([place])          -Examined  patient. -Discussed and educated patient on diabetic foot care, especially with  regards to the vascular, neurological and musculoskeletal systems.  -Stressed the importance of good glycemic control and the detriment of not  controlling glucose levels in relation to the foot. -Mechanically debrided callus x 2 using sterile chisel blade and all nails 1-5 bilateral using sterile nail nipper and filed with dremel without incident  -Recommend good supportive shoes daily for foot type -Answered all patient questions -Patient to return in 3 months for at risk foot care -Patient advised to call the office if any problems or questions arise in the meantime.  [name], DPM

## 2015-10-28 DIAGNOSIS — E119 Type 2 diabetes mellitus without complications: Secondary | ICD-10-CM | POA: Diagnosis not present

## 2015-10-28 DIAGNOSIS — H00025 Hordeolum internum left lower eyelid: Secondary | ICD-10-CM | POA: Diagnosis not present

## 2015-10-28 DIAGNOSIS — H25813 Combined forms of age-related cataract, bilateral: Secondary | ICD-10-CM | POA: Diagnosis not present

## 2015-10-28 DIAGNOSIS — H5213 Myopia, bilateral: Secondary | ICD-10-CM | POA: Diagnosis not present

## 2015-11-27 DIAGNOSIS — E7800 Pure hypercholesterolemia, unspecified: Secondary | ICD-10-CM | POA: Diagnosis not present

## 2015-11-27 DIAGNOSIS — Z79899 Other long term (current) drug therapy: Secondary | ICD-10-CM | POA: Diagnosis not present

## 2015-11-27 DIAGNOSIS — E1141 Type 2 diabetes mellitus with diabetic mononeuropathy: Secondary | ICD-10-CM | POA: Diagnosis not present

## 2015-11-27 DIAGNOSIS — E559 Vitamin D deficiency, unspecified: Secondary | ICD-10-CM | POA: Diagnosis not present

## 2015-11-27 DIAGNOSIS — E039 Hypothyroidism, unspecified: Secondary | ICD-10-CM | POA: Diagnosis not present

## 2015-11-29 DIAGNOSIS — E7800 Pure hypercholesterolemia, unspecified: Secondary | ICD-10-CM | POA: Diagnosis not present

## 2015-11-29 DIAGNOSIS — I10 Essential (primary) hypertension: Secondary | ICD-10-CM | POA: Diagnosis not present

## 2015-11-29 DIAGNOSIS — E1141 Type 2 diabetes mellitus with diabetic mononeuropathy: Secondary | ICD-10-CM | POA: Diagnosis not present

## 2015-11-29 DIAGNOSIS — Z139 Encounter for screening, unspecified: Secondary | ICD-10-CM | POA: Diagnosis not present

## 2015-11-29 DIAGNOSIS — E039 Hypothyroidism, unspecified: Secondary | ICD-10-CM | POA: Diagnosis not present

## 2015-12-10 DIAGNOSIS — H25813 Combined forms of age-related cataract, bilateral: Secondary | ICD-10-CM | POA: Diagnosis not present

## 2015-12-18 DIAGNOSIS — H269 Unspecified cataract: Secondary | ICD-10-CM | POA: Diagnosis not present

## 2015-12-18 DIAGNOSIS — I2510 Atherosclerotic heart disease of native coronary artery without angina pectoris: Secondary | ICD-10-CM | POA: Diagnosis not present

## 2015-12-18 DIAGNOSIS — I10 Essential (primary) hypertension: Secondary | ICD-10-CM | POA: Diagnosis not present

## 2015-12-18 DIAGNOSIS — E1141 Type 2 diabetes mellitus with diabetic mononeuropathy: Secondary | ICD-10-CM | POA: Diagnosis not present

## 2015-12-18 DIAGNOSIS — Z01818 Encounter for other preprocedural examination: Secondary | ICD-10-CM | POA: Diagnosis not present

## 2015-12-30 DIAGNOSIS — J449 Chronic obstructive pulmonary disease, unspecified: Secondary | ICD-10-CM | POA: Diagnosis not present

## 2015-12-30 DIAGNOSIS — Z7984 Long term (current) use of oral hypoglycemic drugs: Secondary | ICD-10-CM | POA: Diagnosis not present

## 2015-12-30 DIAGNOSIS — E785 Hyperlipidemia, unspecified: Secondary | ICD-10-CM | POA: Diagnosis not present

## 2015-12-30 DIAGNOSIS — I10 Essential (primary) hypertension: Secondary | ICD-10-CM | POA: Diagnosis not present

## 2015-12-30 DIAGNOSIS — E1136 Type 2 diabetes mellitus with diabetic cataract: Secondary | ICD-10-CM | POA: Diagnosis not present

## 2015-12-30 DIAGNOSIS — E039 Hypothyroidism, unspecified: Secondary | ICD-10-CM | POA: Diagnosis not present

## 2015-12-30 DIAGNOSIS — H2511 Age-related nuclear cataract, right eye: Secondary | ICD-10-CM | POA: Diagnosis not present

## 2015-12-30 DIAGNOSIS — I2510 Atherosclerotic heart disease of native coronary artery without angina pectoris: Secondary | ICD-10-CM | POA: Diagnosis not present

## 2015-12-30 DIAGNOSIS — Z87891 Personal history of nicotine dependence: Secondary | ICD-10-CM | POA: Diagnosis not present

## 2015-12-30 DIAGNOSIS — Z7902 Long term (current) use of antithrombotics/antiplatelets: Secondary | ICD-10-CM | POA: Diagnosis not present

## 2015-12-30 DIAGNOSIS — I252 Old myocardial infarction: Secondary | ICD-10-CM | POA: Diagnosis not present

## 2015-12-30 DIAGNOSIS — Z79899 Other long term (current) drug therapy: Secondary | ICD-10-CM | POA: Diagnosis not present

## 2016-01-22 ENCOUNTER — Ambulatory Visit (INDEPENDENT_AMBULATORY_CARE_PROVIDER_SITE_OTHER): Payer: Medicaid Other | Admitting: Sports Medicine

## 2016-01-22 ENCOUNTER — Encounter: Payer: Self-pay | Admitting: Sports Medicine

## 2016-01-22 DIAGNOSIS — B351 Tinea unguium: Secondary | ICD-10-CM | POA: Diagnosis not present

## 2016-01-22 DIAGNOSIS — M2040 Other hammer toe(s) (acquired), unspecified foot: Secondary | ICD-10-CM

## 2016-01-22 DIAGNOSIS — E1140 Type 2 diabetes mellitus with diabetic neuropathy, unspecified: Secondary | ICD-10-CM

## 2016-01-22 DIAGNOSIS — M79672 Pain in left foot: Secondary | ICD-10-CM

## 2016-01-22 DIAGNOSIS — M79671 Pain in right foot: Secondary | ICD-10-CM | POA: Diagnosis not present

## 2016-01-22 DIAGNOSIS — L84 Corns and callosities: Secondary | ICD-10-CM | POA: Diagnosis not present

## 2016-01-22 NOTE — Progress Notes (Signed)
Patient ID: Dorothy Pena, female   DOB: 17-Jan-1936, 80 y.o.   MRN: [id number] Subjective: Dorothy Pena is a 80 y.o. female patient with history of type 2 diabetes who returns to office today complaining of Callus and long, painful nails  while ambulating in shoes; unable to trim. Patient states that the glucose reading this morning was not recorded but has been "high and low". Patient denies any new changes in medication or new problems. Patient denies any new cramping, numbness, burning or tingling in the legs.  Patient Active Problem List   Diagnosis Date Noted  . Non-rheumatic mitral regurgitation 03/22/2014  . Syncope 12/12/2013  . Chest pain 12/12/2013  . CAD (coronary artery disease) 12/12/2013  . Dyslipidemia 12/12/2013  . Essential hypertension, benign 12/12/2013  . Orthostatic hypotension 12/12/2013  . Coronary artery disease involving native coronary artery with angina pectoris ([place]) 12/12/2013  . UTI (urinary tract infection) 12/12/2013  . Normocytic anemia 12/12/2013  . Intermediate coronary syndrome [facility])    Current Outpatient Prescriptions on File Prior to Visit  Medication Sig Dispense Refill  . carvedilol (COREG) 25 MG tablet Take 25 mg by mouth 2 (two) times daily with a meal.    . clopidogrel (PLAVIX) 75 MG tablet Take 75 mg by mouth daily with breakfast.    . digoxin (LANOXIN) 0.125 MG tablet Take 0.125 mg by mouth daily.    [name] Kitchen glimepiride (AMARYL) 4 MG tablet Take 4 mg by mouth 2 (two) times daily.    . isosorbide mononitrate (IMDUR) 30 MG 24 hr tablet Take 1 tablet (30 mg total) by mouth daily. 30 tablet 0  . levothyroxine (SYNTHROID, LEVOTHROID) 100 MCG tablet Take 100 mcg by mouth daily before breakfast.    . lisinopril (PRINIVIL,ZESTRIL) 5 MG tablet Take 5 mg by mouth daily.    . metFORMIN (GLUCOPHAGE) 1000 MG tablet Take 1,000 mg by mouth 2 (two) times daily with a meal.    . nitroGLYCERIN (NITROSTAT) 0.4 MG SL tablet Place 0.4 mg under the tongue every 5 (five)  minutes as needed for chest pain.    . rosuvastatin (CRESTOR) 20 MG tablet Take 1 tablet (20 mg total) by mouth daily. 30 tablet 0  . sulfamethoxazole-trimethoprim (BACTRIM DS) 800-160 MG per tablet Take 1 tablet by mouth 2 (two) times daily. 6 tablet 0  . Vitamin D, Ergocalciferol, (DRISDOL) 50000 UNITS CAPS capsule Take 50,000 Units by mouth every 7 (seven) days. On Sunday     No current facility-administered medications on file prior to visit.    No Known Allergies   Objective: General: Patient is awake, alert, and oriented x 3 and in no acute distress.  Integument: Skin is warm, dry and supple bilateral. Nails are tender, long, thickened and dystrophic with subungual debris, consistent with onychomycosis, 1-5 bilateral. No signs of infection. No open lesions. + Callus present on right foot sub met 4, dorsal 4th PIPJ at hammertoe, and left plantar hallux.  Remaining integument unremarkable.  Vasculature:  Dorsalis Pedis pulse 2/4 bilateral. Posterior Tibial pulse  1/4 bilateral.  Capillary fill time <3 sec 1-5 bilateral. Scant hair growth to the level of the digits. Temperature gradient within normal limits.+ varicosities present bilateral. No edema present bilateral.   Neurology: The patient has intact sensation measured with a 5.07/10g Semmes Weinstein Monofilament at all pedal sites bilateral . Vibratory sensation diminished bilateral with tuning fork. No Babinski sign present bilateral.   Musculoskeletal: Asymptomatic hammertoe deformities noted bilateral and left hallux extensus. Muscular strength 5/5 in all  lower extremity muscular groups bilateral without pain on range of motion . No tenderness with calf compression bilateral.  Assessment and Plan: Problem List Items Addressed This Visit    None    Visit Diagnoses    Dermatophytosis of nail    -  Primary   Callus of foot       Foot pain, bilateral       Type 2 diabetes mellitus with diabetic neuropathy, without long-term  current use of insulin ([facility])       Hammertoe, unspecified laterality         -Examined patient. -Discussed and educated patient on diabetic foot care, especially with  regards to the vascular, neurological and musculoskeletal systems.  -Stressed the importance of good glycemic control and the detriment of not  controlling glucose levels in relation to the foot. -Mechanically debrided callus x 3 using sterile chisel blade and all nails 1-5 bilateral using sterile nail nipper and filed with dremel without incident  -Recommend good supportive shoes daily for foot type -Answered all patient questions -Patient to return in 3 months for at risk foot care -Patient advised to call the office if any problems or questions arise in the meantime.  [name], DPM

## 2016-02-27 DIAGNOSIS — I255 Ischemic cardiomyopathy: Secondary | ICD-10-CM | POA: Diagnosis not present

## 2016-02-27 DIAGNOSIS — I10 Essential (primary) hypertension: Secondary | ICD-10-CM | POA: Diagnosis not present

## 2016-02-27 DIAGNOSIS — E088 Diabetes mellitus due to underlying condition with unspecified complications: Secondary | ICD-10-CM | POA: Diagnosis not present

## 2016-02-27 DIAGNOSIS — E785 Hyperlipidemia, unspecified: Secondary | ICD-10-CM | POA: Diagnosis not present

## 2016-02-27 DIAGNOSIS — I2510 Atherosclerotic heart disease of native coronary artery without angina pectoris: Secondary | ICD-10-CM | POA: Diagnosis not present

## 2016-03-17 DIAGNOSIS — I255 Ischemic cardiomyopathy: Secondary | ICD-10-CM | POA: Diagnosis not present

## 2016-03-30 DIAGNOSIS — E1141 Type 2 diabetes mellitus with diabetic mononeuropathy: Secondary | ICD-10-CM | POA: Diagnosis not present

## 2016-03-30 DIAGNOSIS — E7800 Pure hypercholesterolemia, unspecified: Secondary | ICD-10-CM | POA: Diagnosis not present

## 2016-03-30 DIAGNOSIS — E559 Vitamin D deficiency, unspecified: Secondary | ICD-10-CM | POA: Diagnosis not present

## 2016-03-30 DIAGNOSIS — E039 Hypothyroidism, unspecified: Secondary | ICD-10-CM | POA: Diagnosis not present

## 2016-03-30 DIAGNOSIS — Z79899 Other long term (current) drug therapy: Secondary | ICD-10-CM | POA: Diagnosis not present

## 2016-04-02 DIAGNOSIS — Z23 Encounter for immunization: Secondary | ICD-10-CM | POA: Diagnosis not present

## 2016-04-02 DIAGNOSIS — E559 Vitamin D deficiency, unspecified: Secondary | ICD-10-CM | POA: Diagnosis not present

## 2016-04-02 DIAGNOSIS — Z1389 Encounter for screening for other disorder: Secondary | ICD-10-CM | POA: Diagnosis not present

## 2016-04-02 DIAGNOSIS — E1141 Type 2 diabetes mellitus with diabetic mononeuropathy: Secondary | ICD-10-CM | POA: Diagnosis not present

## 2016-04-02 DIAGNOSIS — I10 Essential (primary) hypertension: Secondary | ICD-10-CM | POA: Diagnosis not present

## 2016-04-02 DIAGNOSIS — E039 Hypothyroidism, unspecified: Secondary | ICD-10-CM | POA: Diagnosis not present

## 2016-04-29 ENCOUNTER — Ambulatory Visit (INDEPENDENT_AMBULATORY_CARE_PROVIDER_SITE_OTHER): Payer: Medicare Other | Admitting: Sports Medicine

## 2016-04-29 ENCOUNTER — Encounter: Payer: Self-pay | Admitting: Sports Medicine

## 2016-04-29 DIAGNOSIS — B351 Tinea unguium: Secondary | ICD-10-CM

## 2016-04-29 DIAGNOSIS — E1140 Type 2 diabetes mellitus with diabetic neuropathy, unspecified: Secondary | ICD-10-CM

## 2016-04-29 DIAGNOSIS — L84 Corns and callosities: Secondary | ICD-10-CM | POA: Diagnosis not present

## 2016-04-29 DIAGNOSIS — M79671 Pain in right foot: Secondary | ICD-10-CM

## 2016-04-29 DIAGNOSIS — M79672 Pain in left foot: Secondary | ICD-10-CM

## 2016-04-29 NOTE — Progress Notes (Signed)
Patient ID: Dorothy Pena, female   DOB: Aug 20, 1935, 80 y.o.   MRN: [id number] Subjective: Dorothy Pena is a 80 y.o. female patient with history of type 2 diabetes who returns to office today complaining of Callus and long, painful nails  while ambulating in shoes; unable to trim. Patient states that the glucose reading this morning was not recorded but has been "high and low" however, does not keep a good check. Patient admits a change in medication where they added a medication for her breathing, however, denies any new problems. Patient denies any new cramping, numbness, burning or tingling in the legs.  Patient Active Problem List   Diagnosis Date Noted  . Non-rheumatic mitral regurgitation 03/22/2014  . Syncope 12/12/2013  . Chest pain 12/12/2013  . CAD (coronary artery disease) 12/12/2013  . Dyslipidemia 12/12/2013  . Essential hypertension, benign 12/12/2013  . Orthostatic hypotension 12/12/2013  . Coronary artery disease involving native coronary artery with angina pectoris ([place]) 12/12/2013  . UTI (urinary tract infection) 12/12/2013  . Normocytic anemia 12/12/2013  . Intermediate coronary syndrome [facility])    Current Outpatient Prescriptions on File Prior to Visit  Medication Sig Dispense Refill  . carvedilol (COREG) 25 MG tablet Take 25 mg by mouth 2 (two) times daily with a meal.    . clopidogrel (PLAVIX) 75 MG tablet Take 75 mg by mouth daily with breakfast.    . digoxin (LANOXIN) 0.125 MG tablet Take 0.125 mg by mouth daily.    [name] Kitchen glimepiride (AMARYL) 4 MG tablet Take 4 mg by mouth 2 (two) times daily.    . isosorbide mononitrate (IMDUR) 30 MG 24 hr tablet Take 1 tablet (30 mg total) by mouth daily. 30 tablet 0  . levothyroxine (SYNTHROID, LEVOTHROID) 100 MCG tablet Take 100 mcg by mouth daily before breakfast.    . lisinopril (PRINIVIL,ZESTRIL) 5 MG tablet Take 5 mg by mouth daily.    . metFORMIN (GLUCOPHAGE) 1000 MG tablet Take 1,000 mg by mouth 2 (two) times daily with a meal.    .  nitroGLYCERIN (NITROSTAT) 0.4 MG SL tablet Place 0.4 mg under the tongue every 5 (five) minutes as needed for chest pain.    . rosuvastatin (CRESTOR) 20 MG tablet Take 1 tablet (20 mg total) by mouth daily. 30 tablet 0  . sulfamethoxazole-trimethoprim (BACTRIM DS) 800-160 MG per tablet Take 1 tablet by mouth 2 (two) times daily. 6 tablet 0  . Vitamin D, Ergocalciferol, (DRISDOL) 50000 UNITS CAPS capsule Take 50,000 Units by mouth every 7 (seven) days. On Sunday     No current facility-administered medications on file prior to visit.    No Known Allergies   Objective: General: Patient is awake, alert, and oriented x 3 and in no acute distress.  Integument: Skin is warm, dry and supple bilateral. Nails are tender, long, thickened and dystrophic with subungual debris, consistent with onychomycosis, 1-5 bilateral. No signs of infection. No open lesions. + Callus present on right foot sub met 4, dorsal 4th PIPJ at hammertoe, and left plantar hallux.  Remaining integument unremarkable.  Vasculature:  Dorsalis Pedis pulse 2/4 bilateral. Posterior Tibial pulse  1/4 bilateral.  Capillary fill time <3 sec 1-5 bilateral. Scant hair growth to the level of the digits. Temperature gradient within normal limits.+ varicosities present bilateral. No edema present bilateral.   Neurology: The patient has intact sensation measured with a 5.07/10g Semmes Weinstein Monofilament at all pedal sites bilateral . Vibratory sensation diminished bilateral with tuning fork. No Babinski sign present bilateral.  Musculoskeletal: Asymptomatic hammertoe deformities noted bilateral and left hallux extensus. Muscular strength 5/5 in all lower extremity muscular groups bilateral without pain on range of motion . No tenderness with calf compression bilateral.  Assessment and Plan: Problem List Items Addressed This Visit    None    Visit Diagnoses    Callus of foot    -  Primary   Dermatophytosis of nail       Foot pain,  bilateral       Type 2 diabetes mellitus with diabetic neuropathy, without long-term current use of insulin ([name])         -Examined patient. -Discussed and educated patient on diabetic foot care, especially with  regards to the vascular, neurological and musculoskeletal systems.  -Stressed the importance of good glycemic control and the detriment of not  controlling glucose levels in relation to the foot. -Mechanically debrided callus x 3 using sterile chisel blade and all nails 1-5 bilateral using sterile nail nipper and filed with dremel without incident  -Recommend good supportive shoes daily for foot type -Answered all patient questions -Patient to return in 3 months for at risk foot care -Patient advised to call the office if any problems or questions arise in the meantime.  Dorothy Pena, DPM

## 2016-07-29 ENCOUNTER — Ambulatory Visit (INDEPENDENT_AMBULATORY_CARE_PROVIDER_SITE_OTHER): Payer: Medicare Other | Admitting: Sports Medicine

## 2016-07-29 ENCOUNTER — Encounter: Payer: Self-pay | Admitting: Sports Medicine

## 2016-07-29 DIAGNOSIS — E1140 Type 2 diabetes mellitus with diabetic neuropathy, unspecified: Secondary | ICD-10-CM | POA: Diagnosis not present

## 2016-07-29 DIAGNOSIS — B351 Tinea unguium: Secondary | ICD-10-CM | POA: Diagnosis not present

## 2016-07-29 DIAGNOSIS — Q828 Other specified congenital malformations of skin: Secondary | ICD-10-CM | POA: Diagnosis not present

## 2016-07-29 DIAGNOSIS — M79671 Pain in right foot: Secondary | ICD-10-CM

## 2016-07-29 DIAGNOSIS — M79672 Pain in left foot: Secondary | ICD-10-CM

## 2016-07-29 DIAGNOSIS — L84 Corns and callosities: Secondary | ICD-10-CM

## 2016-07-29 NOTE — Progress Notes (Signed)
Patient ID: Dorothy Pena, female   DOB: 07/15/1935, 81 y.o.   MRN: [id number] Subjective: Dorothy Pena is a 81 y.o. female patient with history of type 2 diabetes who returns to office today complaining of Callus and long, painful nails  while ambulating in shoes; unable to trim. Patient states that the glucose reading this morning was not recorded but has been "high and low" however, does not keep a good check. Patient denies any new problems or medication changes. Patient denies any new cramping, numbness, burning or tingling in the legs.  Patient Active Problem List   Diagnosis Date Noted  . Non-rheumatic mitral regurgitation 03/22/2014  . Syncope 12/12/2013  . Chest pain 12/12/2013  . CAD (coronary artery disease) 12/12/2013  . Dyslipidemia 12/12/2013  . Essential hypertension, benign 12/12/2013  . Orthostatic hypotension 12/12/2013  . Coronary artery disease involving native coronary artery with angina pectoris ([place]) 12/12/2013  . UTI (urinary tract infection) 12/12/2013  . Normocytic anemia 12/12/2013  . Intermediate coronary syndrome [facility])    Current Outpatient Prescriptions on File Prior to Visit  Medication Sig Dispense Refill  . carvedilol (COREG) 25 MG tablet Take 25 mg by mouth 2 (two) times daily with a meal.    . clopidogrel (PLAVIX) 75 MG tablet Take 75 mg by mouth daily with breakfast.    . digoxin (LANOXIN) 0.125 MG tablet Take 0.125 mg by mouth daily.    [name] Kitchen glimepiride (AMARYL) 4 MG tablet Take 4 mg by mouth 2 (two) times daily.    . isosorbide mononitrate (IMDUR) 30 MG 24 hr tablet Take 1 tablet (30 mg total) by mouth daily. 30 tablet 0  . levothyroxine (SYNTHROID, LEVOTHROID) 100 MCG tablet Take 100 mcg by mouth daily before breakfast.    . lisinopril (PRINIVIL,ZESTRIL) 5 MG tablet Take 5 mg by mouth daily.    . metFORMIN (GLUCOPHAGE) 1000 MG tablet Take 1,000 mg by mouth 2 (two) times daily with a meal.    . nitroGLYCERIN (NITROSTAT) 0.4 MG SL tablet Place 0.4 mg under the  tongue every 5 (five) minutes as needed for chest pain.    . rosuvastatin (CRESTOR) 20 MG tablet Take 1 tablet (20 mg total) by mouth daily. 30 tablet 0  . sulfamethoxazole-trimethoprim (BACTRIM DS) 800-160 MG per tablet Take 1 tablet by mouth 2 (two) times daily. 6 tablet 0  . Vitamin D, Ergocalciferol, (DRISDOL) 50000 UNITS CAPS capsule Take 50,000 Units by mouth every 7 (seven) days. On Sunday     No current facility-administered medications on file prior to visit.    No Known Allergies   Objective: General: Patient is awake, alert, and oriented x 3 and in no acute distress.  Integument: Skin is warm, dry and supple bilateral. Nails are tender, long, thickened and dystrophic with subungual debris, consistent with onychomycosis, 1-5 bilateral. No signs of infection. No open lesions. + Callus present on right foot sub met 4,  Remaining integument unremarkable.  Vasculature:  Dorsalis Pedis pulse 2/4 bilateral. Posterior Tibial pulse  1/4 bilateral.  Capillary fill time <3 sec 1-5 bilateral. Scant hair growth to the level of the digits. Temperature gradient within normal limits.+ varicosities present bilateral. No edema present bilateral.   Neurology: The patient has intact sensation measured with a 5.07/10g Semmes Weinstein Monofilament at all pedal sites bilateral . Vibratory sensation diminished bilateral with tuning fork. No Babinski sign present bilateral.   Musculoskeletal: Asymptomatic hammertoe deformities noted bilateral and left hallux extensus. Muscular strength 5/5 in all lower extremity muscular groups  bilateral without pain on range of motion . No tenderness with calf compression bilateral.  Assessment and Plan: Problem List Items Addressed This Visit    None    Visit Diagnoses    Callus of foot    -  Primary   Dermatophytosis of nail       Foot pain, bilateral       Type 2 diabetes mellitus with diabetic neuropathy, without long-term current use of insulin ([place])          -Examined patient. -Discussed and educated patient on diabetic foot care, especially with  regards to the vascular, neurological and musculoskeletal systems.  -Stressed the importance of good glycemic control and the detriment of not  controlling glucose levels in relation to the foot. -Mechanically debrided callus x 1 using sterile chisel blade and all nails 1-5 bilateral using sterile nail nipper and filed with dremel without incident  -Recommend good supportive shoes daily for foot type -Answered all patient questions -Patient to return in 3 months for at risk foot care -Patient advised to call the office if any problems or questions arise in the meantime.  [name], DPM

## 2016-10-28 ENCOUNTER — Ambulatory Visit (INDEPENDENT_AMBULATORY_CARE_PROVIDER_SITE_OTHER): Payer: Medicare Other | Admitting: Sports Medicine

## 2016-10-28 DIAGNOSIS — L84 Corns and callosities: Secondary | ICD-10-CM

## 2016-10-28 DIAGNOSIS — M79671 Pain in right foot: Secondary | ICD-10-CM

## 2016-10-28 DIAGNOSIS — M79672 Pain in left foot: Secondary | ICD-10-CM | POA: Diagnosis not present

## 2016-10-28 DIAGNOSIS — E1140 Type 2 diabetes mellitus with diabetic neuropathy, unspecified: Secondary | ICD-10-CM

## 2016-10-28 DIAGNOSIS — B351 Tinea unguium: Secondary | ICD-10-CM

## 2016-10-28 NOTE — Progress Notes (Signed)
Patient ID: Brady Perea, female   DOB: 03/22/1936, 81 y.o.   MRN: [id number] Subjective: Kamille Felker is a 81 y.o. female patient with history of type 2 diabetes who returns to office today complaining of Callus and long, painful nails  while ambulating in shoes; unable to trim. Patient states that the glucose reading this morning was not recorded but has been "high and low". Patient denies any new problems or medication changes. Patient denies any new cramping, numbness, burning or tingling in the legs.  Admits she has a heart doctor appt coming up.   Patient Active Problem List   Diagnosis Date Noted  . Non-rheumatic mitral regurgitation 03/22/2014  . Syncope 12/12/2013  . Chest pain 12/12/2013  . CAD (coronary artery disease) 12/12/2013  . Dyslipidemia 12/12/2013  . Essential hypertension, benign 12/12/2013  . Orthostatic hypotension 12/12/2013  . Coronary artery disease involving native coronary artery with angina pectoris (HCC) 12/12/2013  . UTI (urinary tract infection) 12/12/2013  . Normocytic anemia 12/12/2013  . Intermediate coronary syndrome (HCC)    Current Outpatient Prescriptions on File Prior to Visit  Medication Sig Dispense Refill  . carvedilol (COREG) 25 MG tablet Take 25 mg by mouth 2 (two) times daily with a meal.    . clopidogrel (PLAVIX) 75 MG tablet Take 75 mg by mouth daily with breakfast.    . digoxin (LANOXIN) 0.125 MG tablet Take 0.125 mg by mouth daily.    . glimepiride (AMARYL) 4 MG tablet Take 4 mg by mouth 2 (two) times daily.    . isosorbide mononitrate (IMDUR) 30 MG 24 hr tablet Take 1 tablet (30 mg total) by mouth daily. 30 tablet 0  . levothyroxine (SYNTHROID, LEVOTHROID) 100 MCG tablet Take 100 mcg by mouth daily before breakfast.    . lisinopril (PRINIVIL,ZESTRIL) 5 MG tablet Take 5 mg by mouth daily.    . metFORMIN (GLUCOPHAGE) 1000 MG tablet Take 1,000 mg by mouth 2 (two) times daily with a meal.    . nitroGLYCERIN (NITROSTAT) 0.4 MG SL tablet Place  0.4 mg under the tongue every 5 (five) minutes as needed for chest pain.    . rosuvastatin (CRESTOR) 20 MG tablet Take 1 tablet (20 mg total) by mouth daily. 30 tablet 0  . sulfamethoxazole-trimethoprim (BACTRIM DS) 800-160 MG per tablet Take 1 tablet by mouth 2 (two) times daily. 6 tablet 0  . Vitamin D, Ergocalciferol, (DRISDOL) 50000 UNITS CAPS capsule Take 50,000 Units by mouth every 7 (seven) days. On Sunday     No current facility-administered medications on file prior to visit.    No Known Allergies   Objective: General: Patient is awake, alert, and oriented x 3 and in no acute distress.  Integument: Skin is warm, dry and supple bilateral. Nails are tender, long, thickened and dystrophic with subungual debris, consistent with onychomycosis, 1-5 bilateral. No signs of infection. No open lesions. + Callus present on right foot sub met 4 and left plantar hallux,  Remaining integument unremarkable.  Vasculature:  Dorsalis Pedis pulse 2/4 bilateral. Posterior Tibial pulse  1/4 bilateral.  Capillary fill time <3 sec 1-5 bilateral. Scant hair growth to the level of the digits. Temperature gradient within normal limits.+ varicosities present bilateral. No edema present bilateral.   Neurology: The patient has intact sensation measured with a 5.07/10g Semmes Weinstein Monofilament at all pedal sites bilateral . Vibratory sensation diminished bilateral with tuning fork. No Babinski sign present bilateral.   Musculoskeletal: Asymptomatic hammertoe deformities noted bilateral and left hallux extensus. Muscular   strength 5/5 in all lower extremity muscular groups bilateral without pain on range of motion . No tenderness with calf compression bilateral.  Assessment and Plan: Problem List Items Addressed This Visit    None    Visit Diagnoses    Dermatophytosis of nail    -  Primary   Callus of foot       Foot pain, bilateral       Type 2 diabetes mellitus with diabetic neuropathy, without  long-term current use of insulin ([place])         -Examined patient. -Discussed and educated patient on diabetic foot care, especially with  regards to the vascular, neurological and musculoskeletal systems.  -Stressed the importance of good glycemic control and the detriment of not  controlling glucose levels in relation to the foot. -Mechanically debrided callus x 2 using sterile chisel blade and all nails 1-5 bilateral using sterile nail nipper and filed with dremel without incident  -Recommend good supportive shoes daily for foot type -Answered all patient questions -Patient to return in 3 months for at risk foot care -Patient advised to call the office if any problems or questions arise in the meantime.  [name], DPM

## 2017-01-27 ENCOUNTER — Ambulatory Visit (INDEPENDENT_AMBULATORY_CARE_PROVIDER_SITE_OTHER): Payer: Medicare Other | Admitting: Sports Medicine

## 2017-01-27 DIAGNOSIS — M79671 Pain in right foot: Secondary | ICD-10-CM | POA: Diagnosis not present

## 2017-01-27 DIAGNOSIS — E1140 Type 2 diabetes mellitus with diabetic neuropathy, unspecified: Secondary | ICD-10-CM | POA: Diagnosis not present

## 2017-01-27 DIAGNOSIS — L84 Corns and callosities: Secondary | ICD-10-CM | POA: Diagnosis not present

## 2017-01-27 DIAGNOSIS — B351 Tinea unguium: Secondary | ICD-10-CM

## 2017-01-27 DIAGNOSIS — M79672 Pain in left foot: Secondary | ICD-10-CM | POA: Diagnosis not present

## 2017-01-27 NOTE — Progress Notes (Signed)
Patient ID: Dorothy Pena, female   DOB: October 05, 1935, 81 y.o.   MRN: [id number] Subjective: Dorothy Pena is a 81 y.o. female patient with history of type 2 diabetes who returns to office today complaining of Callus and long, painful nails  while ambulating in shoes; unable to trim. Patient states that the glucose reading this morning was not recorded but has been "high and low" as noted last visit. Patient denies any new problems or medication changes.   Patient Active Problem List   Diagnosis Date Noted  . Non-rheumatic mitral regurgitation 03/22/2014  . Syncope 12/12/2013  . Chest pain 12/12/2013  . CAD (coronary artery disease) 12/12/2013  . Dyslipidemia 12/12/2013  . Essential hypertension, benign 12/12/2013  . Orthostatic hypotension 12/12/2013  . Coronary artery disease involving native coronary artery with angina pectoris ([place]) 12/12/2013  . UTI (urinary tract infection) 12/12/2013  . Normocytic anemia 12/12/2013  . Intermediate coronary syndrome [facility])    Current Outpatient Prescriptions on File Prior to Visit  Medication Sig Dispense Refill  . carvedilol (COREG) 25 MG tablet Take 25 mg by mouth 2 (two) times daily with a meal.    . clopidogrel (PLAVIX) 75 MG tablet Take 75 mg by mouth daily with breakfast.    . digoxin (LANOXIN) 0.125 MG tablet Take 0.125 mg by mouth daily.    [name] Kitchen glimepiride (AMARYL) 4 MG tablet Take 4 mg by mouth 2 (two) times daily.    . isosorbide mononitrate (IMDUR) 30 MG 24 hr tablet Take 1 tablet (30 mg total) by mouth daily. 30 tablet 0  . levothyroxine (SYNTHROID, LEVOTHROID) 100 MCG tablet Take 100 mcg by mouth daily before breakfast.    . lisinopril (PRINIVIL,ZESTRIL) 5 MG tablet Take 5 mg by mouth daily.    . metFORMIN (GLUCOPHAGE) 1000 MG tablet Take 1,000 mg by mouth 2 (two) times daily with a meal.    . nitroGLYCERIN (NITROSTAT) 0.4 MG SL tablet Place 0.4 mg under the tongue every 5 (five) minutes as needed for chest pain.    . rosuvastatin (CRESTOR) 20 MG  tablet Take 1 tablet (20 mg total) by mouth daily. 30 tablet 0  . sulfamethoxazole-trimethoprim (BACTRIM DS) 800-160 MG per tablet Take 1 tablet by mouth 2 (two) times daily. 6 tablet 0  . Vitamin D, Ergocalciferol, (DRISDOL) 50000 UNITS CAPS capsule Take 50,000 Units by mouth every 7 (seven) days. On Sunday     No current facility-administered medications on file prior to visit.    No Known Allergies   Objective: General: Patient is awake, alert, and oriented x 3 and in no acute distress.  Integument: Skin is warm, dry and supple bilateral. Nails are tender, long, thickened and dystrophic with subungual debris, consistent with onychomycosis, 1-5 bilateral. No signs of infection. No open lesions. + Callus present on right foot sub met 4 and Minimal callus at left plantar hallux,  Remaining integument unremarkable.  Vasculature:  Dorsalis Pedis pulse 2/4 bilateral. Posterior Tibial pulse  1/4 bilateral.  Capillary fill time <3 sec 1-5 bilateral. Scant hair growth to the level of the digits. Temperature gradient within normal limits.+ varicosities present bilateral. No edema present bilateral.   Neurology: The patient has intact sensation measured with a 5.07/10g Semmes Weinstein Monofilament at all pedal sites bilateral . Vibratory sensation diminished bilateral with tuning fork. No Babinski sign present bilateral.   Musculoskeletal: Asymptomatic hammertoe deformities noted bilateral and left hallux extensus. Muscular strength 5/5 in all lower extremity muscular groups bilateral without pain on range of motion .  No tenderness with calf compression bilateral.  Assessment and Plan: Problem List Items Addressed This Visit    None    Visit Diagnoses    Dermatophytosis of nail    -  Primary   Callus of foot       Foot pain, bilateral       Type 2 diabetes mellitus with diabetic neuropathy, without long-term current use of insulin ([place])         -Examined patient. -Discussed and educated  patient on diabetic foot care, especially with  regards to the vascular, neurological and musculoskeletal systems.  -Stressed the importance of good glycemic control and the detriment of not  controlling glucose levels in relation to the foot. -Mechanically debrided callus x1 using sterile chisel blade and all nails 1-5 bilateral using sterile nail nipper and filed with dremel without incident  -Recommend good supportive shoes daily for foot type -Answered all patient questions -Patient to return in 3 months for at risk foot care -Patient advised to call the office if any problems or questions arise in the meantime.  [name], DPM

## 2017-04-29 ENCOUNTER — Ambulatory Visit: Payer: Medicare Other | Admitting: Sports Medicine

## 2017-04-29 ENCOUNTER — Encounter: Payer: Self-pay | Admitting: Sports Medicine

## 2017-04-29 DIAGNOSIS — L84 Corns and callosities: Secondary | ICD-10-CM | POA: Diagnosis not present

## 2017-04-29 DIAGNOSIS — M79672 Pain in left foot: Secondary | ICD-10-CM | POA: Diagnosis not present

## 2017-04-29 DIAGNOSIS — B351 Tinea unguium: Secondary | ICD-10-CM

## 2017-04-29 DIAGNOSIS — M79671 Pain in right foot: Secondary | ICD-10-CM | POA: Diagnosis not present

## 2017-04-29 DIAGNOSIS — E1140 Type 2 diabetes mellitus with diabetic neuropathy, unspecified: Secondary | ICD-10-CM | POA: Diagnosis not present

## 2017-04-29 NOTE — Progress Notes (Signed)
Patient ID: Dorothy Pena, female   DOB: 03/06/1936, 81 y.o.   MRN: [id number] Subjective: Dorothy Pena is a 81 y.o. female patient with history of type 2 diabetes who returns to office today complaining of Callus and long, painful nails  while ambulating in shoes; unable to trim. Patient states that the glucose reading this morning was not recorded. Last visit to PCP was 6 weeks ago. Patient denies any new problems or medication changes.   Patient Active Problem List   Diagnosis Date Noted  . Non-rheumatic mitral regurgitation 03/22/2014  . Syncope 12/12/2013  . Chest pain 12/12/2013  . CAD (coronary artery disease) 12/12/2013  . Dyslipidemia 12/12/2013  . Essential hypertension, benign 12/12/2013  . Orthostatic hypotension 12/12/2013  . Coronary artery disease involving native coronary artery with angina pectoris ([place]) 12/12/2013  . UTI (urinary tract infection) 12/12/2013  . Normocytic anemia 12/12/2013  . Intermediate coronary syndrome [facility])    Current Outpatient Medications on File Prior to Visit  Medication Sig Dispense Refill  . carvedilol (COREG) 25 MG tablet Take 25 mg by mouth 2 (two) times daily with a meal.    . clopidogrel (PLAVIX) 75 MG tablet Take 75 mg by mouth daily with breakfast.    . digoxin (LANOXIN) 0.125 MG tablet Take 0.125 mg by mouth daily.    [name] Kitchen glimepiride (AMARYL) 4 MG tablet Take 4 mg by mouth 2 (two) times daily.    . isosorbide mononitrate (IMDUR) 30 MG 24 hr tablet Take 1 tablet (30 mg total) by mouth daily. 30 tablet 0  . levothyroxine (SYNTHROID, LEVOTHROID) 100 MCG tablet Take 100 mcg by mouth daily before breakfast.    . lisinopril (PRINIVIL,ZESTRIL) 5 MG tablet Take 5 mg by mouth daily.    . metFORMIN (GLUCOPHAGE) 1000 MG tablet Take 1,000 mg by mouth 2 (two) times daily with a meal.    . nitroGLYCERIN (NITROSTAT) 0.4 MG SL tablet Place 0.4 mg under the tongue every 5 (five) minutes as needed for chest pain.    . rosuvastatin (CRESTOR) 20 MG tablet Take 1  tablet (20 mg total) by mouth daily. 30 tablet 0  . sulfamethoxazole-trimethoprim (BACTRIM DS) 800-160 MG per tablet Take 1 tablet by mouth 2 (two) times daily. 6 tablet 0  . Vitamin D, Ergocalciferol, (DRISDOL) 50000 UNITS CAPS capsule Take 50,000 Units by mouth every 7 (seven) days. On Sunday     No current facility-administered medications on file prior to visit.    No Known Allergies   Objective: General: Patient is awake, alert, and oriented x 3 and in no acute distress.  Integument: Skin is warm, dry and supple bilateral. Nails are tender, long, thickened and dystrophic with subungual debris, consistent with onychomycosis, 1-5 bilateral. No signs of infection. No open lesions. + Callus present on right foot sub met 4 and Minimal callus at left plantar hallux,  Remaining integument unremarkable.  Vasculature:  Dorsalis Pedis pulse 2/4 bilateral. Posterior Tibial pulse  1/4 bilateral.  Capillary fill time <3 sec 1-5 bilateral. Scant hair growth to the level of the digits. Temperature gradient within normal limits.+ varicosities present bilateral. No edema present bilateral.   Neurology: The patient has intact sensation measured with a 5.07/10g Semmes Weinstein Monofilament at all pedal sites bilateral . Vibratory sensation diminished bilateral with tuning fork. No Babinski sign present bilateral.   Musculoskeletal: Asymptomatic hammertoe deformities noted bilateral and left hallux extensus. Muscular strength 5/5 in all lower extremity muscular groups bilateral without pain on range of motion . No  tenderness with calf compression bilateral.  Assessment and Plan: Problem List Items Addressed This Visit    None    Visit Diagnoses    Dermatophytosis of nail    -  Primary   Callus of foot       Foot pain, bilateral       Type 2 diabetes mellitus with diabetic neuropathy, without long-term current use of insulin ([place])         -Examined patient. -Discussed and educated patient on  diabetic foot care, especially with  regards to the vascular, neurological and musculoskeletal systems.  -Stressed the importance of good glycemic control and the detriment of not  controlling glucose levels in relation to the foot. -Mechanically debrided callus x1 using sterile chisel blade and all nails 1-5 bilateral using sterile nail nipper and filed with dremel without incident  -Recommend good supportive shoes daily for foot type -Answered all patient questions -Patient to return in 3 months for at risk foot care -Patient advised to call the office if any problems or questions arise in the meantime.  [name], DPM

## 2017-06-25 DIAGNOSIS — I5022 Chronic systolic (congestive) heart failure: Secondary | ICD-10-CM | POA: Insufficient documentation

## 2017-06-25 DIAGNOSIS — E785 Hyperlipidemia, unspecified: Secondary | ICD-10-CM | POA: Insufficient documentation

## 2017-06-29 ENCOUNTER — Ambulatory Visit: Payer: Medicare Other | Admitting: Cardiology

## 2017-07-28 ENCOUNTER — Encounter: Payer: Self-pay | Admitting: Sports Medicine

## 2017-07-28 ENCOUNTER — Ambulatory Visit: Payer: Medicare Other | Admitting: Sports Medicine

## 2017-07-28 DIAGNOSIS — B351 Tinea unguium: Secondary | ICD-10-CM | POA: Diagnosis not present

## 2017-07-28 DIAGNOSIS — E1140 Type 2 diabetes mellitus with diabetic neuropathy, unspecified: Secondary | ICD-10-CM

## 2017-07-28 DIAGNOSIS — M79672 Pain in left foot: Secondary | ICD-10-CM | POA: Diagnosis not present

## 2017-07-28 DIAGNOSIS — M79671 Pain in right foot: Secondary | ICD-10-CM | POA: Diagnosis not present

## 2017-07-28 DIAGNOSIS — L84 Corns and callosities: Secondary | ICD-10-CM

## 2017-07-28 NOTE — Progress Notes (Signed)
Patient ID: Walaa Carel, female   DOB: Mar 05, 1936, 82 y.o.   MRN: [id number] Subjective: Kalkidan Caudell is a 82 y.o. female patient with history of type 2 diabetes who returns to office today complaining of Callus and long, painful nails  while ambulating in shoes; unable to trim. Patient states that the glucose reading this morning was not recorded. Last visit to PCP was 2 weeks ago. Patient denies any new problems or medication changes.   Patient Active Problem List   Diagnosis Date Noted  . Non-rheumatic mitral regurgitation 03/22/2014  . Syncope 12/12/2013  . Chest pain 12/12/2013  . CAD (coronary artery disease) 12/12/2013  . Dyslipidemia 12/12/2013  . Essential hypertension, benign 12/12/2013  . Orthostatic hypotension 12/12/2013  . Coronary artery disease involving native coronary artery with angina pectoris ([place]) 12/12/2013  . UTI (urinary tract infection) 12/12/2013  . Normocytic anemia 12/12/2013  . Intermediate coronary syndrome [facility])    Current Outpatient Medications on File Prior to Visit  Medication Sig Dispense Refill  . carvedilol (COREG) 25 MG tablet Take 25 mg by mouth 2 (two) times daily with a meal.    . clopidogrel (PLAVIX) 75 MG tablet Take 75 mg by mouth daily with breakfast.    . digoxin (LANOXIN) 0.125 MG tablet Take 0.125 mg by mouth daily.    [name] Kitchen glimepiride (AMARYL) 4 MG tablet Take 4 mg by mouth 2 (two) times daily.    . isosorbide mononitrate (IMDUR) 30 MG 24 hr tablet Take 1 tablet (30 mg total) by mouth daily. 30 tablet 0  . levothyroxine (SYNTHROID, LEVOTHROID) 100 MCG tablet Take 100 mcg by mouth daily before breakfast.    . lisinopril (PRINIVIL,ZESTRIL) 5 MG tablet Take 5 mg by mouth daily.    . metFORMIN (GLUCOPHAGE) 1000 MG tablet Take 1,000 mg by mouth 2 (two) times daily with a meal.    . nitroGLYCERIN (NITROSTAT) 0.4 MG SL tablet Place 0.4 mg under the tongue every 5 (five) minutes as needed for chest pain.    . rosuvastatin (CRESTOR) 20 MG tablet Take 1  tablet (20 mg total) by mouth daily. 30 tablet 0  . sulfamethoxazole-trimethoprim (BACTRIM DS) 800-160 MG per tablet Take 1 tablet by mouth 2 (two) times daily. 6 tablet 0  . Vitamin D, Ergocalciferol, (DRISDOL) 50000 UNITS CAPS capsule Take 50,000 Units by mouth every 7 (seven) days. On Sunday     No current facility-administered medications on file prior to visit.    No Known Allergies   Objective: General: Patient is awake, alert, and oriented x 3 and in no acute distress.  Integument: Skin is warm, dry and supple bilateral. Nails are tender, long, thickened and dystrophic with subungual debris, consistent with onychomycosis, 1-5 bilateral. No signs of infection. No open lesions. + Callus present on right foot sub met 4 and Minimal callus at left plantar hallux,  Remaining integument unremarkable.  Vasculature:  Dorsalis Pedis pulse 2/4 bilateral. Posterior Tibial pulse  1/4 bilateral.  Capillary fill time <3 sec 1-5 bilateral. Scant hair growth to the level of the digits. Temperature gradient within normal limits.+ varicosities present bilateral. No edema present bilateral.   Neurology: The patient has intact sensation measured with a 5.07/10g Semmes Weinstein Monofilament at all pedal sites bilateral . Vibratory sensation diminished bilateral with tuning fork. No Babinski sign present bilateral.   Musculoskeletal: Asymptomatic hammertoe deformities noted bilateral and left hallux extensus. Muscular strength 5/5 in all lower extremity muscular groups bilateral without pain on range of motion . No  tenderness with calf compression bilateral.  Assessment and Plan: Problem List Items Addressed This Visit    None    Visit Diagnoses    Dermatophytosis of nail    -  Primary   Callus of foot       Foot pain, bilateral       Type 2 diabetes mellitus with diabetic neuropathy, without long-term current use of insulin ([place])         -Examined patient. -Discussed and educated patient on  diabetic foot care, especially with  regards to the vascular, neurological and musculoskeletal systems.  -Stressed the importance of good glycemic control and the detriment of not  controlling glucose levels in relation to the foot. -Mechanically smoothed callus x1 using rotary bur and debrided all nails 1-5 bilateral using sterile nail nipper and filed with dremel without incident  -Recommend continue with good supportive shoes daily for foot type -Answered all patient questions -Patient to return in 3 months for at risk foot care -Patient advised to call the office if any problems or questions arise in the meantime.  [name], DPM

## 2017-07-30 DIAGNOSIS — I10 Essential (primary) hypertension: Secondary | ICD-10-CM | POA: Diagnosis not present

## 2017-07-30 DIAGNOSIS — N390 Urinary tract infection, site not specified: Secondary | ICD-10-CM | POA: Diagnosis not present

## 2017-07-30 DIAGNOSIS — R55 Syncope and collapse: Secondary | ICD-10-CM | POA: Diagnosis not present

## 2017-07-30 DIAGNOSIS — E119 Type 2 diabetes mellitus without complications: Secondary | ICD-10-CM | POA: Diagnosis not present

## 2017-07-31 DIAGNOSIS — E119 Type 2 diabetes mellitus without complications: Secondary | ICD-10-CM | POA: Diagnosis not present

## 2017-07-31 DIAGNOSIS — R55 Syncope and collapse: Secondary | ICD-10-CM | POA: Diagnosis not present

## 2017-07-31 DIAGNOSIS — I10 Essential (primary) hypertension: Secondary | ICD-10-CM | POA: Diagnosis not present

## 2017-07-31 DIAGNOSIS — N390 Urinary tract infection, site not specified: Secondary | ICD-10-CM | POA: Diagnosis not present

## 2017-10-27 ENCOUNTER — Ambulatory Visit: Payer: Medicare Other | Admitting: Sports Medicine

## 2017-11-17 ENCOUNTER — Ambulatory Visit: Payer: Medicare Other | Admitting: Sports Medicine

## 2017-11-17 ENCOUNTER — Encounter: Payer: Self-pay | Admitting: Sports Medicine

## 2017-11-17 DIAGNOSIS — M79671 Pain in right foot: Secondary | ICD-10-CM

## 2017-11-17 DIAGNOSIS — M79672 Pain in left foot: Secondary | ICD-10-CM | POA: Diagnosis not present

## 2017-11-17 DIAGNOSIS — B351 Tinea unguium: Secondary | ICD-10-CM

## 2017-11-17 DIAGNOSIS — E1140 Type 2 diabetes mellitus with diabetic neuropathy, unspecified: Secondary | ICD-10-CM | POA: Diagnosis not present

## 2017-11-17 DIAGNOSIS — L84 Corns and callosities: Secondary | ICD-10-CM

## 2017-11-17 NOTE — Progress Notes (Signed)
Patient ID: Ratasha Fabre, female   DOB: 1935/10/27, 82 y.o.   MRN: [id number] Subjective: Lynnlee Revels is a 82 y.o. female patient with history of type 2 diabetes who returns to office today complaining of Callus and long, painful nails  while ambulating in shoes; unable to trim. Patient states that the glucose reading this morning was 140. Patient admits a flare of gout in the left foot which was treated by urgent care and is doing much better.  Denies any other acute issues or problems at this time.  Patient Active Problem List   Diagnosis Date Noted  . Non-rheumatic mitral regurgitation 03/22/2014  . Syncope 12/12/2013  . Chest pain 12/12/2013  . CAD (coronary artery disease) 12/12/2013  . Dyslipidemia 12/12/2013  . Essential hypertension, benign 12/12/2013  . Orthostatic hypotension 12/12/2013  . Coronary artery disease involving native coronary artery with angina pectoris ([place]) 12/12/2013  . UTI (urinary tract infection) 12/12/2013  . Normocytic anemia 12/12/2013  . Intermediate coronary syndrome [facility])    Current Outpatient Medications on File Prior to Visit  Medication Sig Dispense Refill  . carvedilol (COREG) 25 MG tablet Take 25 mg by mouth 2 (two) times daily with a meal.    . clopidogrel (PLAVIX) 75 MG tablet Take 75 mg by mouth daily with breakfast.    . digoxin (LANOXIN) 0.125 MG tablet Take 0.125 mg by mouth daily.    [name] Kitchen glimepiride (AMARYL) 4 MG tablet Take 4 mg by mouth 2 (two) times daily.    . isosorbide mononitrate (IMDUR) 30 MG 24 hr tablet Take 1 tablet (30 mg total) by mouth daily. 30 tablet 0  . levothyroxine (SYNTHROID, LEVOTHROID) 100 MCG tablet Take 100 mcg by mouth daily before breakfast.    . lisinopril (PRINIVIL,ZESTRIL) 5 MG tablet Take 5 mg by mouth daily.    . metFORMIN (GLUCOPHAGE) 1000 MG tablet Take 1,000 mg by mouth 2 (two) times daily with a meal.    . nitroGLYCERIN (NITROSTAT) 0.4 MG SL tablet Place 0.4 mg under the tongue every 5 (five) minutes as needed  for chest pain.    . rosuvastatin (CRESTOR) 20 MG tablet Take 1 tablet (20 mg total) by mouth daily. 30 tablet 0  . sulfamethoxazole-trimethoprim (BACTRIM DS) 800-160 MG per tablet Take 1 tablet by mouth 2 (two) times daily. 6 tablet 0  . Vitamin D, Ergocalciferol, (DRISDOL) 50000 UNITS CAPS capsule Take 50,000 Units by mouth every 7 (seven) days. On Sunday     No current facility-administered medications on file prior to visit.    No Known Allergies   Objective: General: Patient is awake, alert, and oriented x 3 and in no acute distress.  Integument: Skin is warm, dry and supple bilateral. Nails are tender, long, thickened and dystrophic with subungual debris, consistent with onychomycosis, 1-5 bilateral. No signs of infection. No open lesions. + Callus present on right foot sub met 4 and Minimal callus at left plantar hallux,  Remaining integument unremarkable.  Vasculature:  Dorsalis Pedis pulse 2/4 bilateral. Posterior Tibial pulse  1/4 bilateral.  Capillary fill time <3 sec 1-5 bilateral. Scant hair growth to the level of the digits. Temperature gradient within normal limits.+ varicosities present bilateral. No edema present bilateral.   Neurology: The patient has intact sensation measured with a 5.07/10g Semmes Weinstein Monofilament at all pedal sites bilateral . Vibratory sensation diminished bilateral with tuning fork. No Babinski sign present bilateral.   Musculoskeletal: Asymptomatic hammertoe deformities noted bilateral and left hallux extensus. Muscular strength 5/5 in  all lower extremity muscular groups bilateral without pain on range of motion . No tenderness with calf compression bilateral.  Assessment and Plan: Problem List Items Addressed This Visit    None    Visit Diagnoses    Dermatophytosis of nail    -  Primary   Callus of foot       Foot pain, bilateral       Type 2 diabetes mellitus with diabetic neuropathy, without long-term current use of insulin ([place])          -Examined patient. -Discussed and educated patient on diabetic foot care, especially with  regards to the vascular, neurological and musculoskeletal systems.  -Stressed the importance of good glycemic control and the detriment of not  controlling glucose levels in relation to the foot. -Mechanically smoothed callus x1 using rotary bur and debrided all nails 1-5 bilateral using sterile nail nipper and filed with dremel without incident  -Recommend continue with good supportive shoes daily for foot type -Recommend patient to follow-up with primary care for long-term management of previous gout attack in the left foot even though now resolved -Patient to return in 3 months for at risk foot care -Patient advised to call the office if any problems or questions arise in the meantime.  [name], DPM

## 2018-02-16 ENCOUNTER — Ambulatory Visit: Payer: Medicare Other | Admitting: Sports Medicine

## 2018-07-29 DIAGNOSIS — I6523 Occlusion and stenosis of bilateral carotid arteries: Secondary | ICD-10-CM | POA: Insufficient documentation

## 2018-11-02 ENCOUNTER — Encounter: Payer: Self-pay | Admitting: Sports Medicine

## 2018-11-02 ENCOUNTER — Other Ambulatory Visit: Payer: Self-pay

## 2018-11-02 ENCOUNTER — Ambulatory Visit: Payer: Medicare Other | Admitting: Sports Medicine

## 2018-11-02 VITALS — BP 108/67 | HR 59 | Temp 96.8°F | Resp 16

## 2018-11-02 DIAGNOSIS — L84 Corns and callosities: Secondary | ICD-10-CM

## 2018-11-02 DIAGNOSIS — M79671 Pain in right foot: Secondary | ICD-10-CM | POA: Diagnosis not present

## 2018-11-02 DIAGNOSIS — B351 Tinea unguium: Secondary | ICD-10-CM

## 2018-11-02 DIAGNOSIS — M79672 Pain in left foot: Secondary | ICD-10-CM | POA: Diagnosis not present

## 2018-11-02 DIAGNOSIS — E1140 Type 2 diabetes mellitus with diabetic neuropathy, unspecified: Secondary | ICD-10-CM

## 2018-11-02 NOTE — Progress Notes (Signed)
Patient ID: Dorothy Pena, female   DOB: 10/31/1935, 83 y.o.   MRN: [id number] Subjective: Dorothy Pena is a 83 y.o. female patient with history of type 2 diabetes who returns to office today complaining of Callus and long, painful nails  while ambulating in shoes; unable to trim. Patient states that the glucose reading this morning was not recorded and reports that she cannot remember her last A1c states that since her last visit she has had episodes of fainting and has been in the hospital but now she is doing much better with no more episodes of findings of falling.  Patient denies any other constitutional symptoms at this time.  Patient Active Problem List   Diagnosis Date Noted  . Non-rheumatic mitral regurgitation 03/22/2014  . Syncope 12/12/2013  . Chest pain 12/12/2013  . CAD (coronary artery disease) 12/12/2013  . Dyslipidemia 12/12/2013  . Essential hypertension, benign 12/12/2013  . Orthostatic hypotension 12/12/2013  . Coronary artery disease involving native coronary artery with angina pectoris ([place]) 12/12/2013  . UTI (urinary tract infection) 12/12/2013  . Normocytic anemia 12/12/2013  . Intermediate coronary syndrome [facility])    Current Outpatient Medications on File Prior to Visit  Medication Sig Dispense Refill  . carvedilol (COREG) 25 MG tablet Take 25 mg by mouth 2 (two) times daily with a meal.    . clopidogrel (PLAVIX) 75 MG tablet Take 75 mg by mouth daily with breakfast.    . digoxin (LANOXIN) 0.125 MG tablet Take 0.125 mg by mouth daily.    . furosemide (LASIX) 20 MG tablet Take by mouth.    [name] Kitchen glimepiride (AMARYL) 4 MG tablet Take 4 mg by mouth 2 (two) times daily.    . isosorbide mononitrate (IMDUR) 30 MG 24 hr tablet Take 1 tablet (30 mg total) by mouth daily. 30 tablet 0  . levothyroxine (SYNTHROID, LEVOTHROID) 100 MCG tablet Take 100 mcg by mouth daily before breakfast.    . lisinopril (PRINIVIL,ZESTRIL) 5 MG tablet Take 5 mg by mouth daily.    . metFORMIN (GLUCOPHAGE)  1000 MG tablet Take 1,000 mg by mouth 2 (two) times daily with a meal.    . nitroGLYCERIN (NITROSTAT) 0.4 MG SL tablet Place 0.4 mg under the tongue every 5 (five) minutes as needed for chest pain.    . ranolazine (RANEXA) 500 MG 12 hr tablet Take by mouth.    . rosuvastatin (CRESTOR) 20 MG tablet Take 1 tablet (20 mg total) by mouth daily. 30 tablet 0  . sulfamethoxazole-trimethoprim (BACTRIM DS) 800-160 MG per tablet Take 1 tablet by mouth 2 (two) times daily. 6 tablet 0  . Vitamin D, Ergocalciferol, (DRISDOL) 50000 UNITS CAPS capsule Take 50,000 Units by mouth every 7 (seven) days. On Sunday     No current facility-administered medications on file prior to visit.    No Known Allergies   Objective: General: Patient is awake, alert, and oriented x 3 and in no acute distress.  Integument: Skin is warm, dry and supple bilateral. Nails are tender, long, thickened and dystrophic with subungual debris, consistent with onychomycosis, 1-5 bilateral. No signs of infection. No open lesions. + Minimal callus present on right foot sub met 4 and Minimal callus at left plantar hallux,  Remaining integument unremarkable.  Vasculature:  Dorsalis Pedis pulse 2/4 bilateral. Posterior Tibial pulse  1/4 bilateral.  Capillary fill time <3 sec 1-5 bilateral. Scant hair growth to the level of the digits. Temperature gradient within normal limits.+ varicosities present bilateral. No edema present bilateral.  Neurology: The patient has intact sensation measured with a 5.07/10g Semmes Weinstein Monofilament at all pedal sites bilateral . Vibratory sensation diminished bilateral with tuning fork. No Babinski sign present bilateral.   Musculoskeletal: Asymptomatic hammertoe deformities noted bilateral and left hallux extensus. Muscular strength 5/5 in all lower extremity muscular groups bilateral without pain on range of motion . No tenderness with calf compression bilateral.  Assessment and Plan: Problem List  Items Addressed This Visit    None    Visit Diagnoses    Dermatophytosis of nail    -  Primary   Callus of foot       Foot pain, bilateral       Type 2 diabetes mellitus with diabetic neuropathy, without long-term current use of insulin ([place])         -Examined patient. -Discussed and educated patient on diabetic foot care, especially with  regards to the vascular, neurological and musculoskeletal systems.  -Mechanically smoothed callus x2 using rotary bur and debrided all nails 1-5 bilateral using sterile nail nipper and filed with dremel without incident  -Recommend continue with good supportive shoes daily for foot type and ambulatory aids to help keep her safe and prevent falls -Patient to return in 3 months for at risk foot care -Patient advised to call the office if any problems or questions arise in the meantime.  [name], DPM

## 2019-02-01 ENCOUNTER — Other Ambulatory Visit: Payer: Self-pay

## 2019-02-01 ENCOUNTER — Encounter: Payer: Self-pay | Admitting: Sports Medicine

## 2019-02-01 ENCOUNTER — Ambulatory Visit: Payer: Medicare Other | Admitting: Sports Medicine

## 2019-02-01 VITALS — Temp 98.2°F | Resp 16

## 2019-02-01 DIAGNOSIS — E1140 Type 2 diabetes mellitus with diabetic neuropathy, unspecified: Secondary | ICD-10-CM | POA: Diagnosis not present

## 2019-02-01 DIAGNOSIS — M79672 Pain in left foot: Secondary | ICD-10-CM

## 2019-02-01 DIAGNOSIS — M79675 Pain in left toe(s): Secondary | ICD-10-CM | POA: Diagnosis not present

## 2019-02-01 DIAGNOSIS — B351 Tinea unguium: Secondary | ICD-10-CM

## 2019-02-01 DIAGNOSIS — L84 Corns and callosities: Secondary | ICD-10-CM

## 2019-02-01 DIAGNOSIS — M79674 Pain in right toe(s): Secondary | ICD-10-CM | POA: Diagnosis not present

## 2019-02-01 DIAGNOSIS — M79671 Pain in right foot: Secondary | ICD-10-CM

## 2019-02-01 NOTE — Progress Notes (Signed)
Patient ID: Dorothy Pena, female   DOB: 03/02/1936, 83 y.o.   MRN: [id number] Subjective: Dorothy Pena is a 83 y.o. female patient with history of type 2 diabetes who returns to office today complaining of long, painful nails  while ambulating in shoes; unable to trim. Patient denies any pain at area of callus.  Patient states that the glucose reading this morning was 98 and does not remember last A1c however had blood work done today by PCP.  Patient denies any other constitutional symptoms at this time.  Patient Active Problem List   Diagnosis Date Noted  . Non-rheumatic mitral regurgitation 03/22/2014  . Syncope 12/12/2013  . Chest pain 12/12/2013  . CAD (coronary artery disease) 12/12/2013  . Dyslipidemia 12/12/2013  . Essential hypertension, benign 12/12/2013  . Orthostatic hypotension 12/12/2013  . Coronary artery disease involving native coronary artery with angina pectoris ([place]) 12/12/2013  . UTI (urinary tract infection) 12/12/2013  . Normocytic anemia 12/12/2013  . Intermediate coronary syndrome [facility])    Current Outpatient Medications on File Prior to Visit  Medication Sig Dispense Refill  . carvedilol (COREG) 25 MG tablet Take 25 mg by mouth 2 (two) times daily with a meal.    . clopidogrel (PLAVIX) 75 MG tablet Take 75 mg by mouth daily with breakfast.    . digoxin (LANOXIN) 0.125 MG tablet Take 0.125 mg by mouth daily.    . furosemide (LASIX) 20 MG tablet Take by mouth.    [name] Kitchen glimepiride (AMARYL) 4 MG tablet Take 4 mg by mouth 2 (two) times daily.    . isosorbide mononitrate (IMDUR) 30 MG 24 hr tablet Take 1 tablet (30 mg total) by mouth daily. 30 tablet 0  . levothyroxine (SYNTHROID, LEVOTHROID) 100 MCG tablet Take 100 mcg by mouth daily before breakfast.    . lisinopril (PRINIVIL,ZESTRIL) 5 MG tablet Take 5 mg by mouth daily.    . metFORMIN (GLUCOPHAGE) 1000 MG tablet Take 1,000 mg by mouth 2 (two) times daily with a meal.    . nitroGLYCERIN (NITROSTAT) 0.4 MG SL tablet Place  0.4 mg under the tongue every 5 (five) minutes as needed for chest pain.    . ranolazine (RANEXA) 500 MG 12 hr tablet Take by mouth.    . rosuvastatin (CRESTOR) 20 MG tablet Take 1 tablet (20 mg total) by mouth daily. 30 tablet 0  . sulfamethoxazole-trimethoprim (BACTRIM DS) 800-160 MG per tablet Take 1 tablet by mouth 2 (two) times daily. 6 tablet 0  . Vitamin D, Ergocalciferol, (DRISDOL) 50000 UNITS CAPS capsule Take 50,000 Units by mouth every 7 (seven) days. On Sunday     No current facility-administered medications on file prior to visit.    No Known Allergies   Objective: General: Patient is awake, alert, and oriented x 3 and in no acute distress.  Integument: Skin is warm, dry and supple bilateral. Nails are tender, long, thickened and dystrophic with subungual debris, consistent with onychomycosis, 1-5 bilateral. No signs of infection. No open lesions. + Minimal callus present on right foot sub met 4 and Minimal callus at left plantar hallux, multiple small scrapes to the right foot with no signs of infection.  Remaining integument unremarkable.  Vasculature:  Dorsalis Pedis pulse 1/4 bilateral. Posterior Tibial pulse  1/4 bilateral.  Capillary fill time <3 sec 1-5 bilateral. Scant hair growth to the level of the digits. Temperature gradient within normal limits.+ varicosities present bilateral. No edema present bilateral.   Neurology: The patient has intact sensation measured with a  5.07/10g Semmes Weinstein Monofilament at all pedal sites bilateral.  Musculoskeletal: Asymptomatic hammertoe deformities noted bilateral and left hallux extensus. Muscular strength 5/5 in all lower extremity muscular groups bilateral without pain on range of motion . No tenderness with calf compression bilateral.  Assessment and Plan: Problem List Items Addressed This Visit    None    Visit Diagnoses    Pain due to onychomycosis of toenails of both feet    -  Primary   Callus of foot       Foot  pain, bilateral       Type 2 diabetes mellitus with diabetic neuropathy, without long-term current use of insulin (Dorothy Pena)         -Examined patient. -Discussed and educated patient on diabetic foot care, especially with  regards to the vascular, neurological and musculoskeletal systems.  -Mechanically smoothed callus x2 using rotary bur and debrided all nails 1-5 bilateral using sterile nail nipper and filed with dremel without incident  -Recommend continue with good supportive shoes daily for foot type and to refrain from picking at her scabs could make them worse or secondarily infected -Patient to return in 3 months for at risk foot care -Patient advised to call the office if any problems or questions arise in the meantime.  Dorothy Pena, DPM

## 2019-05-03 ENCOUNTER — Encounter: Payer: Self-pay | Admitting: Sports Medicine

## 2019-05-03 ENCOUNTER — Ambulatory Visit (INDEPENDENT_AMBULATORY_CARE_PROVIDER_SITE_OTHER): Payer: Medicare Other | Admitting: Sports Medicine

## 2019-05-03 ENCOUNTER — Other Ambulatory Visit: Payer: Self-pay

## 2019-05-03 DIAGNOSIS — M79674 Pain in right toe(s): Secondary | ICD-10-CM | POA: Diagnosis not present

## 2019-05-03 DIAGNOSIS — L84 Corns and callosities: Secondary | ICD-10-CM

## 2019-05-03 DIAGNOSIS — M79672 Pain in left foot: Secondary | ICD-10-CM

## 2019-05-03 DIAGNOSIS — M79675 Pain in left toe(s): Secondary | ICD-10-CM | POA: Diagnosis not present

## 2019-05-03 DIAGNOSIS — M79671 Pain in right foot: Secondary | ICD-10-CM

## 2019-05-03 DIAGNOSIS — B351 Tinea unguium: Secondary | ICD-10-CM | POA: Diagnosis not present

## 2019-05-03 DIAGNOSIS — E1140 Type 2 diabetes mellitus with diabetic neuropathy, unspecified: Secondary | ICD-10-CM | POA: Diagnosis not present

## 2019-05-03 NOTE — Progress Notes (Signed)
Patient ID: Dorothy Pena, female   DOB: 02-Oct-1935, 83 y.o.   MRN: [id number] Subjective: Dorothy Pena is a 83 y.o. female patient with history of type 2 diabetes who returns to office today complaining of long, painful nails  while ambulating in shoes; unable to trim. Patient denies any pain at area of callus.  Patient states that the glucose reading this morning was NOT CHECKED AND DOES NOT KNOW A1C.  Patient denies any other constitutional symptoms at this time.  Patient Active Problem List   Diagnosis Date Noted  . Non-rheumatic mitral regurgitation 03/22/2014  . Syncope 12/12/2013  . Chest pain 12/12/2013  . CAD (coronary artery disease) 12/12/2013  . Dyslipidemia 12/12/2013  . Essential hypertension, benign 12/12/2013  . Orthostatic hypotension 12/12/2013  . Coronary artery disease involving native coronary artery with angina pectoris ([place]) 12/12/2013  . UTI (urinary tract infection) 12/12/2013  . Normocytic anemia 12/12/2013  . Intermediate coronary syndrome [facility])    Current Outpatient Medications on File Prior to Visit  Medication Sig Dispense Refill  . carvedilol (COREG) 25 MG tablet Take 25 mg by mouth 2 (two) times daily with a meal.    . clopidogrel (PLAVIX) 75 MG tablet Take 75 mg by mouth daily with breakfast.    . digoxin (LANOXIN) 0.125 MG tablet Take 0.125 mg by mouth daily.    . furosemide (LASIX) 20 MG tablet Take by mouth.    [name] Kitchen glimepiride (AMARYL) 4 MG tablet Take 4 mg by mouth 2 (two) times daily.    . isosorbide mononitrate (IMDUR) 30 MG 24 hr tablet Take 1 tablet (30 mg total) by mouth daily. 30 tablet 0  . levothyroxine (SYNTHROID, LEVOTHROID) 100 MCG tablet Take 100 mcg by mouth daily before breakfast.    . lisinopril (PRINIVIL,ZESTRIL) 5 MG tablet Take 5 mg by mouth daily.    . metFORMIN (GLUCOPHAGE) 1000 MG tablet Take 1,000 mg by mouth 2 (two) times daily with a meal.    . nitroGLYCERIN (NITROSTAT) 0.4 MG SL tablet Place 0.4 mg under the tongue every 5 (five)  minutes as needed for chest pain.    . ranolazine (RANEXA) 500 MG 12 hr tablet Take by mouth.    . rosuvastatin (CRESTOR) 20 MG tablet Take 1 tablet (20 mg total) by mouth daily. 30 tablet 0  . sulfamethoxazole-trimethoprim (BACTRIM DS) 800-160 MG per tablet Take 1 tablet by mouth 2 (two) times daily. 6 tablet 0  . Vitamin D, Ergocalciferol, (DRISDOL) 50000 UNITS CAPS capsule Take 50,000 Units by mouth every 7 (seven) days. On Sunday     No current facility-administered medications on file prior to visit.    No Known Allergies   Objective: General: Patient is awake, alert, and oriented x 3 and in no acute distress.  Integument: Skin is warm, dry and supple bilateral. Nails are tender, long, thickened and dystrophic with subungual debris, consistent with onychomycosis, 1-5 bilateral. No signs of infection. No open lesions. + Minimal callus present on right foot sub met 4 and Minimal callus at left plantar hallux, multiple small scrapes to the right foot with no signs of infection.  Remaining integument unremarkable.  Vasculature:  Dorsalis Pedis pulse 1/4 bilateral. Posterior Tibial pulse  1/4 bilateral.  Capillary fill time <3 sec 1-5 bilateral. Scant hair growth to the level of the digits. Temperature gradient within normal limits.+ varicosities present bilateral. No edema present bilateral.   Neurology: The patient has intact sensation measured with a 5.07/10g Semmes Weinstein Monofilament at all pedal sites  bilateral.  Musculoskeletal: Asymptomatic hammertoe deformities noted bilateral and left hallux extensus. Muscular strength 5/5 in all lower extremity muscular groups bilateral without pain on range of motion . No tenderness with calf compression bilateral.  Assessment and Plan: Problem List Items Addressed This Visit    None    Visit Diagnoses    Pain due to onychomycosis of toenails of both feet    -  Primary   Callus of foot       Foot pain, bilateral       Type 2 diabetes  mellitus with diabetic neuropathy, without long-term current use of insulin ([place])         -Examined patient. -Re-discussed and educated patient on diabetic foot care, especially with  regards to the vascular, neurological and musculoskeletal systems.  -Mechanically smoothed callus x2 using rotary bur and debrided all nails 1-5 bilateral using sterile nail nipper and filed with dremel without incident  -Recommend continue with good supportive shoes daily  -Patient to return in 3 months for at risk foot care -Patient advised to call the office if any problems or questions arise in the meantime.  [name], DPM

## 2019-08-02 ENCOUNTER — Ambulatory Visit: Payer: Medicare Other | Admitting: Sports Medicine

## 2019-08-03 ENCOUNTER — Other Ambulatory Visit: Payer: Self-pay

## 2019-08-03 ENCOUNTER — Encounter: Payer: Self-pay | Admitting: Sports Medicine

## 2019-08-03 ENCOUNTER — Ambulatory Visit (INDEPENDENT_AMBULATORY_CARE_PROVIDER_SITE_OTHER): Payer: Medicare Other | Admitting: Sports Medicine

## 2019-08-03 DIAGNOSIS — B351 Tinea unguium: Secondary | ICD-10-CM | POA: Diagnosis not present

## 2019-08-03 DIAGNOSIS — E1140 Type 2 diabetes mellitus with diabetic neuropathy, unspecified: Secondary | ICD-10-CM | POA: Diagnosis not present

## 2019-08-03 DIAGNOSIS — L84 Corns and callosities: Secondary | ICD-10-CM

## 2019-08-03 DIAGNOSIS — M79671 Pain in right foot: Secondary | ICD-10-CM

## 2019-08-03 DIAGNOSIS — M79674 Pain in right toe(s): Secondary | ICD-10-CM | POA: Diagnosis not present

## 2019-08-03 DIAGNOSIS — M79675 Pain in left toe(s): Secondary | ICD-10-CM

## 2019-08-03 DIAGNOSIS — M79672 Pain in left foot: Secondary | ICD-10-CM

## 2019-08-03 NOTE — Progress Notes (Addendum)
Patient ID: Dorothy Pena, female   DOB: 1936-02-05, 84 y.o.   MRN: [id number] Subjective: Dorothy Pena is a 84 y.o. female patient with history of type 2 diabetes who returns to office today complaining of long, painful nails  while ambulating in shoes; unable to trim. Patient denies any pain at area of callus.  Patient states that the glucose reading this morning was NOT CHECKED AND DOES NOT KNOW A1C, like before reports her doctor keeps track of it.  Patient denies any other constitutional symptoms at this time.  Last PCP visit with Dr. Ferman Hamming was 3 months ago.  Patient Active Problem List   Diagnosis Date Noted  . Non-rheumatic mitral regurgitation 03/22/2014  . Syncope 12/12/2013  . Chest pain 12/12/2013  . CAD (coronary artery disease) 12/12/2013  . Dyslipidemia 12/12/2013  . Essential hypertension, benign 12/12/2013  . Orthostatic hypotension 12/12/2013  . Coronary artery disease involving native coronary artery with angina pectoris ([place]) 12/12/2013  . UTI (urinary tract infection) 12/12/2013  . Normocytic anemia 12/12/2013  . Intermediate coronary syndrome [facility])    Current Outpatient Medications on File Prior to Visit  Medication Sig Dispense Refill  . carvedilol (COREG) 25 MG tablet Take 25 mg by mouth 2 (two) times daily with a meal.    . clopidogrel (PLAVIX) 75 MG tablet Take 75 mg by mouth daily with breakfast.    . digoxin (LANOXIN) 0.125 MG tablet Take 0.125 mg by mouth daily.    . furosemide (LASIX) 20 MG tablet Take by mouth.    [name] Kitchen glimepiride (AMARYL) 4 MG tablet Take 4 mg by mouth 2 (two) times daily.    [name] Kitchen lisinopril (PRINIVIL,ZESTRIL) 5 MG tablet Take 5 mg by mouth daily.    . metFORMIN (GLUCOPHAGE) 1000 MG tablet Take 1,000 mg by mouth 2 (two) times daily with a meal.    . rosuvastatin (CRESTOR) 20 MG tablet Take 1 tablet (20 mg total) by mouth daily. 30 tablet 0  . Vitamin D, Ergocalciferol, (DRISDOL) 50000 UNITS CAPS capsule Take 50,000 Units by mouth every 7 (seven)  days. On Sunday     No current facility-administered medications on file prior to visit.   No Known Allergies   Objective: General: Patient is awake, alert, and oriented x 3 and in no acute distress.  Integument: Skin is warm, dry and supple bilateral. Nails are tender, long, thickened and dystrophic with subungual debris, consistent with onychomycosis, 1-5 bilateral. No signs of infection. No open lesions. + Minimal callus present on right foot sub met 4 and Minimal callus at left plantar hallux, multiple small scrapes to the right foot with no signs of infection.  Remaining integument unremarkable.  Vasculature:  Dorsalis Pedis pulse 1/4 bilateral. Posterior Tibial pulse  1/4 bilateral.  Capillary fill time <3 sec 1-5 bilateral. Scant hair growth to the level of the digits. Temperature gradient within normal limits.+ varicosities present bilateral. No edema present bilateral.   Neurology: The patient has intact sensation measured with a 5.07/10g Semmes Weinstein Monofilament at all pedal sites bilateral.  Musculoskeletal: Asymptomatic hammertoe deformities noted bilateral and left hallux extensus. Muscular strength 5/5 in all lower extremity muscular groups bilateral without pain on range of motion . No tenderness with calf compression bilateral.  Assessment and Plan: Problem List Items Addressed This Visit    None    Visit Diagnoses    Pain due to onychomycosis of toenails of both feet    -  Primary   Callus of foot  Foot pain, bilateral       Type 2 diabetes mellitus with diabetic neuropathy, without long-term current use of insulin ([facility])         -Examined patient. -Discussed daily foot inspection in the setting of diabetes -Mechanically smoothed callus x2 using rotary bur and debrided all nails 1-5 bilateral using sterile nail nipper and filed with dremel without incident  -Recommend daily skin emollients for dry skin -Recommend continue with good supportive shoes daily   -Patient to return in 3 months for at risk foot care -Patient advised to call the office if any problems or questions arise in the meantime.  [name], DPM

## 2019-08-24 ENCOUNTER — Ambulatory Visit: Payer: Medicare Other | Attending: Internal Medicine

## 2019-08-24 DIAGNOSIS — Z23 Encounter for immunization: Secondary | ICD-10-CM

## 2019-08-24 NOTE — Progress Notes (Signed)
   Covid-19 Vaccination Clinic  Name:  Dorothy Pena    MRN: [id number] DOB: 03-06-1936  08/24/2019  Dorothy Pena was observed post Covid-19 immunization for 15 minutes without incident. She was provided with Vaccine Information Sheet and instruction to access the V-Safe system.   Dorothy Pena was instructed to call 911 with any severe reactions post vaccine: [name] Kitchen Difficulty breathing  . Swelling of face and throat  . A fast heartbeat  . A bad rash all over body  . Dizziness and weakness   Immunizations Administered    Name Date Dose VIS Date Route   Pfizer COVID-19 Vaccine 08/24/2019 12:29 PM 0.3 mL 06/02/2019 Intramuscular   Manufacturer: [place]   Lot: [id number]   [place]: [id number]

## 2019-09-08 DIAGNOSIS — I6523 Occlusion and stenosis of bilateral carotid arteries: Secondary | ICD-10-CM | POA: Diagnosis not present

## 2019-09-08 DIAGNOSIS — I340 Nonrheumatic mitral (valve) insufficiency: Secondary | ICD-10-CM | POA: Diagnosis not present

## 2019-09-08 DIAGNOSIS — I110 Hypertensive heart disease with heart failure: Secondary | ICD-10-CM | POA: Diagnosis not present

## 2019-09-08 DIAGNOSIS — I5022 Chronic systolic (congestive) heart failure: Secondary | ICD-10-CM | POA: Diagnosis not present

## 2019-09-08 DIAGNOSIS — I2510 Atherosclerotic heart disease of native coronary artery without angina pectoris: Secondary | ICD-10-CM | POA: Diagnosis not present

## 2019-09-12 DIAGNOSIS — I6523 Occlusion and stenosis of bilateral carotid arteries: Secondary | ICD-10-CM | POA: Diagnosis not present

## 2019-09-12 DIAGNOSIS — R0989 Other specified symptoms and signs involving the circulatory and respiratory systems: Secondary | ICD-10-CM | POA: Diagnosis not present

## 2019-09-12 DIAGNOSIS — E7849 Other hyperlipidemia: Secondary | ICD-10-CM | POA: Diagnosis not present

## 2019-09-12 DIAGNOSIS — I509 Heart failure, unspecified: Secondary | ICD-10-CM | POA: Diagnosis not present

## 2019-09-12 DIAGNOSIS — I110 Hypertensive heart disease with heart failure: Secondary | ICD-10-CM | POA: Diagnosis not present

## 2019-09-19 ENCOUNTER — Ambulatory Visit: Payer: Medicare Other | Attending: Internal Medicine

## 2019-09-19 DIAGNOSIS — Z23 Encounter for immunization: Secondary | ICD-10-CM

## 2019-09-19 NOTE — Progress Notes (Signed)
   Covid-19 Vaccination Clinic  Name:  Dorothy Pena    MRN: [id number] DOB: 08/14/1935  09/19/2019  [name] was observed post Covid-19 immunization for 15 minutes without incident. She was provided with Vaccine Information Sheet and instruction to access the V-Safe system.   [name] was instructed to call 911 with any severe reactions post vaccine: [name] Kitchen Difficulty breathing  . Swelling of face and throat  . A fast heartbeat  . A bad rash all over body  . Dizziness and weakness   Immunizations Administered    Name Date Dose VIS Date Route   Pfizer COVID-19 Vaccine 09/19/2019  3:39 PM 0.3 mL 06/02/2019 Intramuscular   Manufacturer: [place]   Lot: [id number]   [place]: [id number]

## 2019-09-27 DIAGNOSIS — I340 Nonrheumatic mitral (valve) insufficiency: Secondary | ICD-10-CM | POA: Diagnosis not present

## 2019-09-27 DIAGNOSIS — I5022 Chronic systolic (congestive) heart failure: Secondary | ICD-10-CM | POA: Diagnosis not present

## 2019-11-01 ENCOUNTER — Encounter: Payer: Self-pay | Admitting: Sports Medicine

## 2019-11-01 ENCOUNTER — Other Ambulatory Visit: Payer: Self-pay

## 2019-11-01 ENCOUNTER — Ambulatory Visit (INDEPENDENT_AMBULATORY_CARE_PROVIDER_SITE_OTHER): Payer: Medicare Other | Admitting: Sports Medicine

## 2019-11-01 DIAGNOSIS — M79671 Pain in right foot: Secondary | ICD-10-CM

## 2019-11-01 DIAGNOSIS — M79674 Pain in right toe(s): Secondary | ICD-10-CM | POA: Diagnosis not present

## 2019-11-01 DIAGNOSIS — M79675 Pain in left toe(s): Secondary | ICD-10-CM | POA: Diagnosis not present

## 2019-11-01 DIAGNOSIS — E1140 Type 2 diabetes mellitus with diabetic neuropathy, unspecified: Secondary | ICD-10-CM

## 2019-11-01 DIAGNOSIS — B351 Tinea unguium: Secondary | ICD-10-CM | POA: Diagnosis not present

## 2019-11-01 DIAGNOSIS — L84 Corns and callosities: Secondary | ICD-10-CM

## 2019-11-01 DIAGNOSIS — M79672 Pain in left foot: Secondary | ICD-10-CM

## 2019-11-01 NOTE — Progress Notes (Signed)
Patient ID: Dorothy Pena, female   DOB: [birth year], 84 y.o.   MRN: [id number] Subjective: Dorothy Pena is a 84 y.o. female patient with history of type 2 diabetes who returns to office today complaining of long, painful nails  while ambulating in shoes; unable to trim.  Patient states that the glucose reading this morning was NOT CHECKED AND DOES NOT KNOW A1C, like before reports her doctor keeps track of it.  Patient denies any other constitutional symptoms at this time.  Last PCP visit with Dr. Ferman Hamming don't remember.   Patient Active Problem List   Diagnosis Date Noted  . Non-rheumatic mitral regurgitation 03/22/2014  . Syncope 12/12/2013  . Chest pain 12/12/2013  . CAD (coronary artery disease) 12/12/2013  . Dyslipidemia 12/12/2013  . Essential hypertension, benign 12/12/2013  . Orthostatic hypotension 12/12/2013  . Coronary artery disease involving native coronary artery with angina pectoris ([place]) 12/12/2013  . UTI (urinary tract infection) 12/12/2013  . Normocytic anemia 12/12/2013  . Intermediate coronary syndrome [facility])    Current Outpatient Medications on File Prior to Visit  Medication Sig Dispense Refill  . carvedilol (COREG) 25 MG tablet Take 25 mg by mouth 2 (two) times daily with a meal.    . clopidogrel (PLAVIX) 75 MG tablet Take 75 mg by mouth daily with breakfast.    . digoxin (LANOXIN) 0.125 MG tablet Take 0.125 mg by mouth daily.    . furosemide (LASIX) 20 MG tablet Take by mouth.    [name] Kitchen glimepiride (AMARYL) 4 MG tablet Take 4 mg by mouth 2 (two) times daily.    [name] Kitchen lisinopril (PRINIVIL,ZESTRIL) 5 MG tablet Take 5 mg by mouth daily.    . metFORMIN (GLUCOPHAGE) 1000 MG tablet Take 1,000 mg by mouth 2 (two) times daily with a meal.    . rosuvastatin (CRESTOR) 20 MG tablet Take 1 tablet (20 mg total) by mouth daily. 30 tablet 0  . Vitamin D, Ergocalciferol, (DRISDOL) 50000 UNITS CAPS capsule Take 50,000 Units by mouth every 7 (seven) days. On Sunday     No current  facility-administered medications on file prior to visit.   No Known Allergies   Objective: General: Patient is awake, alert, and oriented x 3 and in no acute distress.  Integument: Skin is warm, dry and supple bilateral. Nails are tender, long, thickened and dystrophic with subungual debris, consistent with onychomycosis, 1-5 bilateral. No signs of infection. No open lesions. + Very Minimal callus present on right foot sub met 4 and Minimal callus at left plantar hallux, multiple small scrapes to the right foot with no signs of infection.  Remaining integument unremarkable.  Vasculature:  Dorsalis Pedis pulse 1/4 bilateral. Posterior Tibial pulse  1/4 bilateral. Capillary fill time <3 sec 1-5 bilateral. Scant hair growth to the level of the digits.Temperature gradient within normal limits.+ varicosities present bilateral. No edema present bilateral.   Neurology: The patient has intact sensation measured with a 5.07/10g Semmes Weinstein Monofilament at all pedal sites bilateral.  Musculoskeletal: Asymptomatic hammertoe deformities noted bilateral and left hallux extensus. Muscular strength 5/5 in all lower extremity muscular groups bilateral without pain on range of motion . No tenderness with calf compression bilateral.  Assessment and Plan: Problem List Items Addressed This Visit    None    Visit Diagnoses    Pain due to onychomycosis of toenails of both feet    -  Primary   Callus of foot       Foot pain, bilateral  Type 2 diabetes mellitus with diabetic neuropathy, without long-term current use of insulin ([facility])         -Examined patient. -Discussed daily foot inspection in the setting of diabetes -Mechanically smoothed callus x2 using rotary bur and debrided all nails 1-5 bilateral using sterile nail nipper and filed with dremel without incident  -Recommend continue with daily skin emollients for dry skin -Recommend continue with good supportive shoes daily  -Patient to  return in 3 months for at risk foot care -Patient advised to call the office if any problems or questions arise in the meantime.  [name], DPM

## 2019-11-29 DIAGNOSIS — I10 Essential (primary) hypertension: Secondary | ICD-10-CM | POA: Diagnosis not present

## 2019-11-29 DIAGNOSIS — E7800 Pure hypercholesterolemia, unspecified: Secondary | ICD-10-CM | POA: Diagnosis not present

## 2019-11-29 DIAGNOSIS — Z5181 Encounter for therapeutic drug level monitoring: Secondary | ICD-10-CM | POA: Diagnosis not present

## 2019-11-29 DIAGNOSIS — Z139 Encounter for screening, unspecified: Secondary | ICD-10-CM | POA: Diagnosis not present

## 2019-11-29 DIAGNOSIS — E1149 Type 2 diabetes mellitus with other diabetic neurological complication: Secondary | ICD-10-CM | POA: Diagnosis not present

## 2019-11-29 DIAGNOSIS — E039 Hypothyroidism, unspecified: Secondary | ICD-10-CM | POA: Diagnosis not present

## 2019-11-29 DIAGNOSIS — E559 Vitamin D deficiency, unspecified: Secondary | ICD-10-CM | POA: Diagnosis not present

## 2020-01-08 DIAGNOSIS — I10 Essential (primary) hypertension: Secondary | ICD-10-CM | POA: Diagnosis not present

## 2020-01-08 DIAGNOSIS — L080 Pyoderma: Secondary | ICD-10-CM | POA: Diagnosis not present

## 2020-01-08 DIAGNOSIS — R600 Localized edema: Secondary | ICD-10-CM | POA: Diagnosis not present

## 2020-01-08 DIAGNOSIS — I208 Other forms of angina pectoris: Secondary | ICD-10-CM | POA: Diagnosis not present

## 2020-01-22 DIAGNOSIS — L080 Pyoderma: Secondary | ICD-10-CM | POA: Diagnosis not present

## 2020-01-22 DIAGNOSIS — R600 Localized edema: Secondary | ICD-10-CM | POA: Diagnosis not present

## 2020-02-08 DIAGNOSIS — Z9181 History of falling: Secondary | ICD-10-CM | POA: Diagnosis not present

## 2020-02-08 DIAGNOSIS — E785 Hyperlipidemia, unspecified: Secondary | ICD-10-CM | POA: Diagnosis not present

## 2020-02-08 DIAGNOSIS — Z0000 Encounter for general adult medical examination without abnormal findings: Secondary | ICD-10-CM | POA: Diagnosis not present

## 2020-03-06 ENCOUNTER — Ambulatory Visit: Payer: Medicare Other | Admitting: Sports Medicine

## 2020-03-13 ENCOUNTER — Ambulatory Visit (INDEPENDENT_AMBULATORY_CARE_PROVIDER_SITE_OTHER): Payer: Medicare Other | Admitting: Sports Medicine

## 2020-03-13 ENCOUNTER — Other Ambulatory Visit: Payer: Self-pay

## 2020-03-13 ENCOUNTER — Encounter: Payer: Self-pay | Admitting: Sports Medicine

## 2020-03-13 DIAGNOSIS — M79675 Pain in left toe(s): Secondary | ICD-10-CM | POA: Diagnosis not present

## 2020-03-13 DIAGNOSIS — B351 Tinea unguium: Secondary | ICD-10-CM | POA: Diagnosis not present

## 2020-03-13 DIAGNOSIS — M79672 Pain in left foot: Secondary | ICD-10-CM | POA: Diagnosis not present

## 2020-03-13 DIAGNOSIS — M79674 Pain in right toe(s): Secondary | ICD-10-CM

## 2020-03-13 DIAGNOSIS — M79671 Pain in right foot: Secondary | ICD-10-CM | POA: Diagnosis not present

## 2020-03-13 DIAGNOSIS — L84 Corns and callosities: Secondary | ICD-10-CM

## 2020-03-13 DIAGNOSIS — E1140 Type 2 diabetes mellitus with diabetic neuropathy, unspecified: Secondary | ICD-10-CM

## 2020-03-13 NOTE — Progress Notes (Signed)
Patient ID: Dorothy Pena, female   DOB: [birth year], 84 y.o.   MRN: [id number] Subjective: Dorothy Pena is a 84 y.o. female patient with history of type 2 diabetes who returns to office today complaining of long, painful nails  while ambulating in shoes; unable to trim.  Patient states that the glucose reading this morning was NOT CHECKED AND DOES NOT KNOW A1C, like before reports her doctor keeps track of it like before.  Patient denies any other constitutional symptoms at this time.  Last PCP visit with Dr. Ferman Hamming don't remember like previous.   Patient Active Problem List   Diagnosis Date Noted  . Non-rheumatic mitral regurgitation 03/22/2014  . Syncope 12/12/2013  . Chest pain 12/12/2013  . CAD (coronary artery disease) 12/12/2013  . Dyslipidemia 12/12/2013  . Essential hypertension, benign 12/12/2013  . Orthostatic hypotension 12/12/2013  . Coronary artery disease involving native coronary artery with angina pectoris ([place]) 12/12/2013  . UTI (urinary tract infection) 12/12/2013  . Normocytic anemia 12/12/2013  . Intermediate coronary syndrome [facility])    Current Outpatient Medications on File Prior to Visit  Medication Sig Dispense Refill  . carvedilol (COREG) 25 MG tablet Take 25 mg by mouth 2 (two) times daily with a meal.    . clopidogrel (PLAVIX) 75 MG tablet Take 75 mg by mouth daily with breakfast.    . digoxin (LANOXIN) 0.125 MG tablet Take 0.125 mg by mouth daily.    . furosemide (LASIX) 20 MG tablet Take by mouth.    [name] Kitchen glimepiride (AMARYL) 4 MG tablet Take 4 mg by mouth 2 (two) times daily.    [name] Kitchen lisinopril (PRINIVIL,ZESTRIL) 5 MG tablet Take 5 mg by mouth daily.    . metFORMIN (GLUCOPHAGE) 1000 MG tablet Take 1,000 mg by mouth 2 (two) times daily with a meal.    . rosuvastatin (CRESTOR) 20 MG tablet Take 1 tablet (20 mg total) by mouth daily. 30 tablet 0  . Vitamin D, Ergocalciferol, (DRISDOL) 50000 UNITS CAPS capsule Take 50,000 Units by mouth every 7 (seven) days. On Sunday      No current facility-administered medications on file prior to visit.   No Known Allergies   Objective: General: Patient is awake, alert, and oriented x 3 and in no acute distress.  Integument: Skin is warm, dry and supple bilateral. Nails are tender, long, thickened and dystrophic with subungual debris, consistent with onychomycosis, 1-5 bilateral. No signs of infection. No open lesions. + Very Minimal callus present on right foot sub met 4 and Minimal callus at left plantar hallux, multiple small scrapes to the right foot with no signs of infection.  Remaining integument unremarkable.  Vasculature:  Dorsalis Pedis pulse 1/4 bilateral. Posterior Tibial pulse  1/4 bilateral. Capillary fill time <3 sec 1-5 bilateral. Scant hair growth to the level of the digits.Temperature gradient within normal limits.+ varicosities present bilateral. No edema present bilateral.   Neurology: The patient has intact sensation measured with a 5.07/10g Semmes Weinstein Monofilament at all pedal sites bilateral.  Musculoskeletal: Asymptomatic hammertoe deformities noted bilateral and left hallux extensus. Muscular strength 5/5 in all lower extremity muscular groups bilateral without pain on range of motion . No tenderness with calf compression bilateral.  Assessment and Plan: Problem List Items Addressed This Visit    None    Visit Diagnoses    Pain due to onychomycosis of toenails of both feet    -  Primary   Callus of foot       Foot pain, bilateral  Type 2 diabetes mellitus with diabetic neuropathy, without long-term current use of insulin (HCC)         -Examined patient. -Discussed daily foot inspection in the setting of diabetes -Mechanically smoothed callus x2 using rotary bur and debrided all nails 1-5 bilateral using sterile nail nipper and filed with dremel without incident   -Recommend continue with daily skin emollients for dry skin like previous -Patient to return in 3 months for at risk  foot care -Patient advised to call the office if any problems or questions arise in the meantime.  [name], DPM

## 2020-03-19 DIAGNOSIS — I6523 Occlusion and stenosis of bilateral carotid arteries: Secondary | ICD-10-CM | POA: Diagnosis not present

## 2020-03-19 DIAGNOSIS — I5022 Chronic systolic (congestive) heart failure: Secondary | ICD-10-CM | POA: Diagnosis not present

## 2020-03-19 DIAGNOSIS — I110 Hypertensive heart disease with heart failure: Secondary | ICD-10-CM | POA: Diagnosis not present

## 2020-03-19 DIAGNOSIS — E7849 Other hyperlipidemia: Secondary | ICD-10-CM | POA: Diagnosis not present

## 2020-03-19 DIAGNOSIS — I2510 Atherosclerotic heart disease of native coronary artery without angina pectoris: Secondary | ICD-10-CM | POA: Diagnosis not present

## 2020-04-20 DIAGNOSIS — R0789 Other chest pain: Secondary | ICD-10-CM | POA: Diagnosis not present

## 2020-04-20 DIAGNOSIS — I2510 Atherosclerotic heart disease of native coronary artery without angina pectoris: Secondary | ICD-10-CM | POA: Diagnosis not present

## 2020-04-20 DIAGNOSIS — R1013 Epigastric pain: Secondary | ICD-10-CM | POA: Diagnosis not present

## 2020-04-20 DIAGNOSIS — I2699 Other pulmonary embolism without acute cor pulmonale: Secondary | ICD-10-CM | POA: Diagnosis not present

## 2020-04-20 DIAGNOSIS — Z87891 Personal history of nicotine dependence: Secondary | ICD-10-CM | POA: Diagnosis not present

## 2020-04-20 DIAGNOSIS — J90 Pleural effusion, not elsewhere classified: Secondary | ICD-10-CM | POA: Diagnosis not present

## 2020-04-20 DIAGNOSIS — Z7902 Long term (current) use of antithrombotics/antiplatelets: Secondary | ICD-10-CM | POA: Diagnosis not present

## 2020-04-20 DIAGNOSIS — J439 Emphysema, unspecified: Secondary | ICD-10-CM | POA: Diagnosis not present

## 2020-04-20 DIAGNOSIS — E785 Hyperlipidemia, unspecified: Secondary | ICD-10-CM | POA: Diagnosis not present

## 2020-04-20 DIAGNOSIS — R0602 Shortness of breath: Secondary | ICD-10-CM | POA: Diagnosis not present

## 2020-04-20 DIAGNOSIS — Z7984 Long term (current) use of oral hypoglycemic drugs: Secondary | ICD-10-CM | POA: Diagnosis not present

## 2020-04-20 DIAGNOSIS — R059 Cough, unspecified: Secondary | ICD-10-CM | POA: Diagnosis not present

## 2020-04-20 DIAGNOSIS — I714 Abdominal aortic aneurysm, without rupture: Secondary | ICD-10-CM | POA: Diagnosis not present

## 2020-04-20 DIAGNOSIS — I719 Aortic aneurysm of unspecified site, without rupture: Secondary | ICD-10-CM | POA: Diagnosis not present

## 2020-04-20 DIAGNOSIS — R079 Chest pain, unspecified: Secondary | ICD-10-CM | POA: Diagnosis not present

## 2020-04-20 DIAGNOSIS — E119 Type 2 diabetes mellitus without complications: Secondary | ICD-10-CM | POA: Diagnosis not present

## 2020-04-20 DIAGNOSIS — E039 Hypothyroidism, unspecified: Secondary | ICD-10-CM | POA: Diagnosis not present

## 2020-04-20 DIAGNOSIS — R911 Solitary pulmonary nodule: Secondary | ICD-10-CM | POA: Diagnosis not present

## 2020-04-20 DIAGNOSIS — M1990 Unspecified osteoarthritis, unspecified site: Secondary | ICD-10-CM | POA: Diagnosis not present

## 2020-04-20 DIAGNOSIS — G3184 Mild cognitive impairment, so stated: Secondary | ICD-10-CM | POA: Diagnosis not present

## 2020-04-20 DIAGNOSIS — I110 Hypertensive heart disease with heart failure: Secondary | ICD-10-CM | POA: Diagnosis not present

## 2020-04-20 DIAGNOSIS — I5043 Acute on chronic combined systolic (congestive) and diastolic (congestive) heart failure: Secondary | ICD-10-CM | POA: Diagnosis not present

## 2020-04-20 DIAGNOSIS — R531 Weakness: Secondary | ICD-10-CM | POA: Diagnosis not present

## 2020-04-20 DIAGNOSIS — Z79899 Other long term (current) drug therapy: Secondary | ICD-10-CM | POA: Diagnosis not present

## 2020-04-20 DIAGNOSIS — I5023 Acute on chronic systolic (congestive) heart failure: Secondary | ICD-10-CM | POA: Diagnosis not present

## 2020-04-20 DIAGNOSIS — I252 Old myocardial infarction: Secondary | ICD-10-CM | POA: Diagnosis not present

## 2020-04-20 DIAGNOSIS — I361 Nonrheumatic tricuspid (valve) insufficiency: Secondary | ICD-10-CM | POA: Diagnosis not present

## 2020-04-20 DIAGNOSIS — I340 Nonrheumatic mitral (valve) insufficiency: Secondary | ICD-10-CM | POA: Diagnosis not present

## 2020-04-21 DIAGNOSIS — I340 Nonrheumatic mitral (valve) insufficiency: Secondary | ICD-10-CM | POA: Diagnosis not present

## 2020-04-21 DIAGNOSIS — I361 Nonrheumatic tricuspid (valve) insufficiency: Secondary | ICD-10-CM | POA: Diagnosis not present

## 2020-05-03 DIAGNOSIS — I5022 Chronic systolic (congestive) heart failure: Secondary | ICD-10-CM | POA: Diagnosis not present

## 2020-05-03 DIAGNOSIS — Z23 Encounter for immunization: Secondary | ICD-10-CM | POA: Diagnosis not present

## 2020-05-03 DIAGNOSIS — I255 Ischemic cardiomyopathy: Secondary | ICD-10-CM | POA: Diagnosis not present

## 2020-05-03 DIAGNOSIS — E1141 Type 2 diabetes mellitus with diabetic mononeuropathy: Secondary | ICD-10-CM | POA: Diagnosis not present

## 2020-05-03 DIAGNOSIS — I6523 Occlusion and stenosis of bilateral carotid arteries: Secondary | ICD-10-CM | POA: Diagnosis not present

## 2020-05-03 DIAGNOSIS — Z79899 Other long term (current) drug therapy: Secondary | ICD-10-CM | POA: Diagnosis not present

## 2020-05-03 DIAGNOSIS — I340 Nonrheumatic mitral (valve) insufficiency: Secondary | ICD-10-CM | POA: Diagnosis not present

## 2020-05-10 DIAGNOSIS — I6523 Occlusion and stenosis of bilateral carotid arteries: Secondary | ICD-10-CM | POA: Diagnosis not present

## 2020-05-10 DIAGNOSIS — I110 Hypertensive heart disease with heart failure: Secondary | ICD-10-CM | POA: Diagnosis not present

## 2020-05-10 DIAGNOSIS — E785 Hyperlipidemia, unspecified: Secondary | ICD-10-CM | POA: Diagnosis not present

## 2020-05-10 DIAGNOSIS — I2510 Atherosclerotic heart disease of native coronary artery without angina pectoris: Secondary | ICD-10-CM | POA: Diagnosis not present

## 2020-05-10 DIAGNOSIS — I5022 Chronic systolic (congestive) heart failure: Secondary | ICD-10-CM | POA: Diagnosis not present

## 2020-05-30 DIAGNOSIS — I10 Essential (primary) hypertension: Secondary | ICD-10-CM | POA: Diagnosis not present

## 2020-05-30 DIAGNOSIS — E559 Vitamin D deficiency, unspecified: Secondary | ICD-10-CM | POA: Diagnosis not present

## 2020-05-30 DIAGNOSIS — E1149 Type 2 diabetes mellitus with other diabetic neurological complication: Secondary | ICD-10-CM | POA: Diagnosis not present

## 2020-05-30 DIAGNOSIS — E7800 Pure hypercholesterolemia, unspecified: Secondary | ICD-10-CM | POA: Diagnosis not present

## 2020-05-30 DIAGNOSIS — R42 Dizziness and giddiness: Secondary | ICD-10-CM | POA: Diagnosis not present

## 2020-05-30 DIAGNOSIS — E039 Hypothyroidism, unspecified: Secondary | ICD-10-CM | POA: Diagnosis not present

## 2020-05-30 DIAGNOSIS — Z5181 Encounter for therapeutic drug level monitoring: Secondary | ICD-10-CM | POA: Diagnosis not present

## 2020-05-30 DIAGNOSIS — R0602 Shortness of breath: Secondary | ICD-10-CM | POA: Diagnosis not present

## 2020-07-16 ENCOUNTER — Other Ambulatory Visit: Payer: Self-pay

## 2020-07-16 ENCOUNTER — Encounter: Payer: Self-pay | Admitting: Sports Medicine

## 2020-07-16 ENCOUNTER — Ambulatory Visit (INDEPENDENT_AMBULATORY_CARE_PROVIDER_SITE_OTHER): Payer: Medicare Other | Admitting: Sports Medicine

## 2020-07-16 DIAGNOSIS — E1140 Type 2 diabetes mellitus with diabetic neuropathy, unspecified: Secondary | ICD-10-CM | POA: Diagnosis not present

## 2020-07-16 DIAGNOSIS — M79674 Pain in right toe(s): Secondary | ICD-10-CM

## 2020-07-16 DIAGNOSIS — M79675 Pain in left toe(s): Secondary | ICD-10-CM | POA: Diagnosis not present

## 2020-07-16 DIAGNOSIS — M79671 Pain in right foot: Secondary | ICD-10-CM

## 2020-07-16 DIAGNOSIS — M79672 Pain in left foot: Secondary | ICD-10-CM

## 2020-07-16 DIAGNOSIS — B351 Tinea unguium: Secondary | ICD-10-CM | POA: Diagnosis not present

## 2020-07-16 DIAGNOSIS — L84 Corns and callosities: Secondary | ICD-10-CM

## 2020-07-16 NOTE — Progress Notes (Signed)
Patient ID: Dorothy Pena, female   DOB: [birth year], 85 y.o.   MRN: [id number] Subjective: Dorothy Pena is a 85 y.o. female patient with history of type 2 diabetes who returns to office today complaining of long, painful nails  while ambulating in shoes; unable to trim.  Patient states that the glucose reading this morning was NOT CHECKED AND DOES NOT KNOW A1C, like before. No other pedal complaints.  Last PCP not sure.   Patient Active Problem List   Diagnosis Date Noted  . Bilateral carotid artery stenosis 07/29/2018  . Chronic systolic congestive heart failure ([place]) 06/25/2017  . Hyperlipidemia 06/25/2017  . Diabetes mellitus due to underlying condition with unspecified complications ([place]) 62/83/6629  . Ischemic cardiomyopathy 02/27/2016  . Non-rheumatic mitral regurgitation 03/22/2014  . Syncope 12/12/2013  . Chest pain 12/12/2013  . CAD (coronary artery disease) 12/12/2013  . Dyslipidemia 12/12/2013  . Essential hypertension, benign 12/12/2013  . Orthostatic hypotension 12/12/2013  . Coronary artery disease involving native coronary artery with angina pectoris ([place]) 12/12/2013  . UTI (urinary tract infection) 12/12/2013  . Normocytic anemia 12/12/2013  . Intermediate coronary syndrome [facility])    Current Outpatient Medications on File Prior to Visit  Medication Sig Dispense Refill  . nitroGLYCERIN (NITROSTAT) 0.4 MG SL tablet Place under the tongue.    . ranolazine (RANEXA) 500 MG 12 hr tablet Take 1 tablet by mouth 2 (two) times daily.    . carvedilol (COREG) 25 MG tablet Take 25 mg by mouth 2 (two) times daily with a meal.    . clopidogrel (PLAVIX) 75 MG tablet Take 75 mg by mouth daily with breakfast.    . digoxin (LANOXIN) 0.125 MG tablet Take 0.125 mg by mouth daily.    . furosemide (LASIX) 20 MG tablet Take by mouth.    [name] Kitchen glimepiride (AMARYL) 4 MG tablet Take 4 mg by mouth 2 (two) times daily.    [name] Kitchen levothyroxine (SYNTHROID) 100 MCG tablet Take 100 mcg by mouth daily.    [name] Kitchen  lisinopril (PRINIVIL,ZESTRIL) 5 MG tablet Take 5 mg by mouth daily.    . metFORMIN (GLUCOPHAGE) 1000 MG tablet Take 1,000 mg by mouth 2 (two) times daily with a meal.    . mupirocin ointment (BACTROBAN) 2 % Apply 1 application topically daily.    . rosuvastatin (CRESTOR) 20 MG tablet Take 1 tablet (20 mg total) by mouth daily. 30 tablet 0  . Vitamin D, Ergocalciferol, (DRISDOL) 50000 UNITS CAPS capsule Take 50,000 Units by mouth every 7 (seven) days. On Sunday     No current facility-administered medications on file prior to visit.   No Known Allergies   Objective: General: Patient is awake, alert, and oriented x 3 and in no acute distress.  Integument: Skin is warm, dry and supple bilateral. Nails are tender, long, thickened and dystrophic with subungual debris, consistent with onychomycosis, 1-5 bilateral. No signs of infection. No open lesions. + Very Minimal callus present on right foot sub met 4 and Minimal callus at left plantar hallux.  Remaining integument unremarkable.  Vasculature:  Dorsalis Pedis pulse 1/4 bilateral. Posterior Tibial pulse  1/4 bilateral. Capillary fill time <3 sec 1-5 bilateral. Scant hair growth to the level of the digits.Temperature gradient within normal limits.+ varicosities present bilateral. No edema present bilateral.   Neurology: The patient has intact sensation measured with a 5.07/10g Semmes Weinstein Monofilament at all pedal sites bilateral.  Musculoskeletal: Asymptomatic hammertoe deformities noted bilateral and left hallux extensus. Muscular strength 5/5 in all lower extremity  muscular groups bilateral without pain on range of motion . No tenderness with calf compression bilateral.  Assessment and Plan: Problem List Items Addressed This Visit   None   Visit Diagnoses    Pain due to onychomycosis of toenails of both feet    -  Primary   Relevant Medications   mupirocin ointment (BACTROBAN) 2 %   Callus of foot       Foot pain, bilateral        Type 2 diabetes mellitus with diabetic neuropathy, without long-term current use of insulin (HCC)         -Examined patient. -Discussed daily foot inspection in the setting of diabetes -Mechanically smoothed callus x2 using rotary bur and debrided all nails 1-5 bilateral using sterile nail nipper and filed with dremel without incident   -Patient to return in 3 months for at risk foot care -Patient advised to call the office if any problems or questions arise in the meantime.  [name], DPM

## 2020-09-03 DIAGNOSIS — E1149 Type 2 diabetes mellitus with other diabetic neurological complication: Secondary | ICD-10-CM | POA: Diagnosis not present

## 2020-09-03 DIAGNOSIS — I5022 Chronic systolic (congestive) heart failure: Secondary | ICD-10-CM | POA: Diagnosis not present

## 2020-09-03 DIAGNOSIS — I10 Essential (primary) hypertension: Secondary | ICD-10-CM | POA: Diagnosis not present

## 2020-09-03 DIAGNOSIS — I6523 Occlusion and stenosis of bilateral carotid arteries: Secondary | ICD-10-CM | POA: Diagnosis not present

## 2020-09-03 DIAGNOSIS — I340 Nonrheumatic mitral (valve) insufficiency: Secondary | ICD-10-CM | POA: Diagnosis not present

## 2020-09-03 DIAGNOSIS — I2510 Atherosclerotic heart disease of native coronary artery without angina pectoris: Secondary | ICD-10-CM | POA: Diagnosis not present

## 2020-09-03 DIAGNOSIS — Z9114 Patient's other noncompliance with medication regimen: Secondary | ICD-10-CM | POA: Diagnosis not present

## 2020-09-03 DIAGNOSIS — R42 Dizziness and giddiness: Secondary | ICD-10-CM | POA: Diagnosis not present

## 2020-09-03 DIAGNOSIS — I208 Other forms of angina pectoris: Secondary | ICD-10-CM | POA: Diagnosis not present

## 2020-09-03 DIAGNOSIS — E559 Vitamin D deficiency, unspecified: Secondary | ICD-10-CM | POA: Diagnosis not present

## 2020-09-03 DIAGNOSIS — E039 Hypothyroidism, unspecified: Secondary | ICD-10-CM | POA: Diagnosis not present

## 2020-09-03 DIAGNOSIS — E7800 Pure hypercholesterolemia, unspecified: Secondary | ICD-10-CM | POA: Diagnosis not present

## 2020-09-04 DIAGNOSIS — Z7902 Long term (current) use of antithrombotics/antiplatelets: Secondary | ICD-10-CM | POA: Diagnosis not present

## 2020-09-04 DIAGNOSIS — E119 Type 2 diabetes mellitus without complications: Secondary | ICD-10-CM | POA: Diagnosis not present

## 2020-09-04 DIAGNOSIS — I10 Essential (primary) hypertension: Secondary | ICD-10-CM | POA: Diagnosis not present

## 2020-09-04 DIAGNOSIS — Z7984 Long term (current) use of oral hypoglycemic drugs: Secondary | ICD-10-CM | POA: Diagnosis not present

## 2020-09-04 DIAGNOSIS — Z79899 Other long term (current) drug therapy: Secondary | ICD-10-CM | POA: Diagnosis not present

## 2020-09-04 DIAGNOSIS — E039 Hypothyroidism, unspecified: Secondary | ICD-10-CM | POA: Diagnosis not present

## 2020-09-04 DIAGNOSIS — D696 Thrombocytopenia, unspecified: Secondary | ICD-10-CM | POA: Diagnosis not present

## 2020-09-04 DIAGNOSIS — I252 Old myocardial infarction: Secondary | ICD-10-CM | POA: Diagnosis not present

## 2020-09-04 DIAGNOSIS — J449 Chronic obstructive pulmonary disease, unspecified: Secondary | ICD-10-CM | POA: Diagnosis not present

## 2020-09-04 DIAGNOSIS — R899 Unspecified abnormal finding in specimens from other organs, systems and tissues: Secondary | ICD-10-CM | POA: Diagnosis not present

## 2020-09-04 DIAGNOSIS — Z87891 Personal history of nicotine dependence: Secondary | ICD-10-CM | POA: Diagnosis not present

## 2020-09-12 DIAGNOSIS — H5212 Myopia, left eye: Secondary | ICD-10-CM | POA: Diagnosis not present

## 2020-09-12 DIAGNOSIS — H52223 Regular astigmatism, bilateral: Secondary | ICD-10-CM | POA: Diagnosis not present

## 2020-09-12 DIAGNOSIS — E119 Type 2 diabetes mellitus without complications: Secondary | ICD-10-CM | POA: Diagnosis not present

## 2020-09-12 DIAGNOSIS — H5201 Hypermetropia, right eye: Secondary | ICD-10-CM | POA: Diagnosis not present

## 2020-09-17 DIAGNOSIS — I6523 Occlusion and stenosis of bilateral carotid arteries: Secondary | ICD-10-CM | POA: Diagnosis not present

## 2020-09-17 DIAGNOSIS — I10 Essential (primary) hypertension: Secondary | ICD-10-CM | POA: Diagnosis not present

## 2020-09-17 DIAGNOSIS — E1159 Type 2 diabetes mellitus with other circulatory complications: Secondary | ICD-10-CM | POA: Diagnosis not present

## 2020-09-17 DIAGNOSIS — E785 Hyperlipidemia, unspecified: Secondary | ICD-10-CM | POA: Diagnosis not present

## 2020-09-18 DIAGNOSIS — C44529 Squamous cell carcinoma of skin of other part of trunk: Secondary | ICD-10-CM | POA: Diagnosis not present

## 2020-09-18 DIAGNOSIS — L814 Other melanin hyperpigmentation: Secondary | ICD-10-CM | POA: Diagnosis not present

## 2020-09-18 DIAGNOSIS — L578 Other skin changes due to chronic exposure to nonionizing radiation: Secondary | ICD-10-CM | POA: Diagnosis not present

## 2020-09-20 ENCOUNTER — Other Ambulatory Visit: Payer: Self-pay | Admitting: *Deleted

## 2020-09-20 NOTE — Patient Outreach (Signed)
[facility]) Care Management  09/20/2020  Sarahbeth Cashin Oct 11, 1935 [id number]  Telephone outreach for [facility] Referral for Complex Care Management: CVD, DM, CHF, HTN  Talked briefly with Ms. Culverhouse. She did not seem sure of herself in answering questions. Educated on services. Pt states she lives with her daughter and grand daughter. Advised I will send a letter with our information and request that [name], call for more information.  [name]. Myrtie Neither, MSN, [facility] Gerontological Nurse Practitioner  [facility] Care Management [phone]

## 2020-09-26 DIAGNOSIS — C44529 Squamous cell carcinoma of skin of other part of trunk: Secondary | ICD-10-CM | POA: Diagnosis not present

## 2020-10-04 ENCOUNTER — Other Ambulatory Visit: Payer: Self-pay | Admitting: *Deleted

## 2020-10-04 NOTE — Patient Outreach (Signed)
[facility]) Care Management  10/04/2020  Dorothy Pena 07-Nov-1935 [id number]  Telephone outreach for [facility] referral, following up from sending letter with [facility] information to see if enrollment for services is desired. Unsuccessful. Left a message requesting a return call. Will call again next Friday if no response.  [name]. Myrtie Neither, MSN, [facility] Gerontological Nurse Practitioner [facility] Care Management [phone]

## 2020-10-11 ENCOUNTER — Other Ambulatory Visit: Payer: Self-pay | Admitting: *Deleted

## 2020-10-11 NOTE — Patient Outreach (Signed)
[facility]) Care Management  10/11/2020  Dorothy Pena [birth year] [id number]  Final telephone outreach. Talked with [name] briefly then with her daughter, Dorothy Pena, who is her primary care giver. She says she never say the letter I sent. Her mother probably threw it away. Reviewed our services. Daughter not sure if she wants to enroll, however, she did discuss her mothers conditions and that she is not weighing daily for HF, does not have a glucose monitor, has had multiple falls upon arising with semi LOC and becoming diaphoretic. Does not use her walker. Does not have a BP cuff. Dorothy Pena said she would prefer to enroll in the [facility] chronic disease program.  Will referr.  [name]. Myrtie Neither, MSN, [facility] Gerontological Nurse Practitioner [facility] Care Management [phone]

## 2020-10-24 ENCOUNTER — Encounter: Payer: Self-pay | Admitting: Podiatry

## 2020-10-24 ENCOUNTER — Other Ambulatory Visit: Payer: Self-pay

## 2020-10-24 ENCOUNTER — Ambulatory Visit: Payer: Medicare Other | Admitting: Podiatry

## 2020-10-24 DIAGNOSIS — M79675 Pain in left toe(s): Secondary | ICD-10-CM

## 2020-10-24 DIAGNOSIS — L84 Corns and callosities: Secondary | ICD-10-CM

## 2020-10-24 DIAGNOSIS — E1140 Type 2 diabetes mellitus with diabetic neuropathy, unspecified: Secondary | ICD-10-CM

## 2020-10-24 DIAGNOSIS — B351 Tinea unguium: Secondary | ICD-10-CM | POA: Diagnosis not present

## 2020-10-24 DIAGNOSIS — M79674 Pain in right toe(s): Secondary | ICD-10-CM | POA: Diagnosis not present

## 2020-11-01 DIAGNOSIS — H2512 Age-related nuclear cataract, left eye: Secondary | ICD-10-CM | POA: Diagnosis not present

## 2020-11-01 NOTE — Progress Notes (Signed)
  Subjective:  Patient ID: Dorothy Pena, female    DOB: Nov 03, 1935,  MRN: [id number]  85 y.o. female presents with at risk foot care with history of diabetic neuropathy and callus(es) b/l feet and painful thick toenails that are difficult to trim. Painful toenails interfere with ambulation. Aggravating factors include wearing enclosed shoe gear. Pain is relieved with periodic professional debridement. Painful calluses are aggravated when weightbearing with and without shoegear. Pain is relieved with periodic professional debridement.    Patient did not check blood glucose this morning.  PCP: Philmore Pali, NP and last visit was: 05/03/2020.  Review of Systems: Negative except as noted in the HPI.   No Known Allergies  Objective:  There were no vitals filed for this visit. Constitutional Patient is a pleasant 85 y.o. African American female in NAD. AAO x 3.  Vascular Capillary refill time to digits immediate b/l. Capillary refill time to remaining digits <3 seconds. Faintly palpable pedal pulses b/l. Pedal hair sparse. Lower extremity skin temperature gradient within normal limits. No pain with calf compression b/l. No cyanosis or clubbing noted.  Neurologic Normal speech. Protective sensation intact 5/5 intact bilaterally with 10g monofilament b/l.  Dermatologic Pedal skin with normal turgor, texture and tone bilaterally. No open wounds bilaterally. No interdigital macerations bilaterally. Toenails 1-5 b/l elongated, discolored, dystrophic, thickened, crumbly with subungual debris and tenderness to dorsal palpation. Hyperkeratotic lesion(s) L hallux and submet head 4 right foot.  No erythema, no edema, no drainage, no fluctuance.  Orthopedic: Normal muscle strength 5/5 to all lower extremity muscle groups bilaterally. No pain crepitus or joint limitation noted with ROM b/l. Hammertoes noted to the b/l lower extremities.    Assessment:   1. Pain due to onychomycosis of toenails of both feet   2.  Callus of foot   3. Type 2 diabetes mellitus with diabetic neuropathy, without long-term current use of insulin ([place])    Plan:  Patient was evaluated and treated and all questions answered.  Onychomycosis with pain -Nails palliatively debridement as below. -Educated on self-care  Procedure: Nail Debridement Rationale: Pain Type of Debridement: manual, sharp debridement. Instrumentation: Nail nipper, rotary burr. Number of Nails: 10  -Examined patient. -Continue diabetic foot care principles. -Patient to continue soft, supportive shoe gear daily. -Toenails 1-5 b/l were debrided in length and girth with sterile nail nippers and dremel without iatrogenic bleeding.  -Callus(es) L hallux and submet head 4 right foot pared utilizing sterile scalpel blade without complication or incident. Total number debrided =2. -Patient to report any pedal injuries to medical professional immediately. -Patient/POA to call should there be question/concern in the interim.  Return in about 3 months (around 01/24/2021).  Marzetta Board, DPM

## 2020-11-05 DIAGNOSIS — L299 Pruritus, unspecified: Secondary | ICD-10-CM | POA: Diagnosis not present

## 2020-11-08 DIAGNOSIS — I5022 Chronic systolic (congestive) heart failure: Secondary | ICD-10-CM | POA: Diagnosis not present

## 2020-11-08 DIAGNOSIS — I110 Hypertensive heart disease with heart failure: Secondary | ICD-10-CM | POA: Diagnosis not present

## 2020-11-08 DIAGNOSIS — I340 Nonrheumatic mitral (valve) insufficiency: Secondary | ICD-10-CM | POA: Diagnosis not present

## 2020-11-08 DIAGNOSIS — E785 Hyperlipidemia, unspecified: Secondary | ICD-10-CM | POA: Diagnosis not present

## 2020-11-08 DIAGNOSIS — I2510 Atherosclerotic heart disease of native coronary artery without angina pectoris: Secondary | ICD-10-CM | POA: Diagnosis not present

## 2020-11-08 DIAGNOSIS — I6523 Occlusion and stenosis of bilateral carotid arteries: Secondary | ICD-10-CM | POA: Diagnosis not present

## 2020-12-16 DIAGNOSIS — I5022 Chronic systolic (congestive) heart failure: Secondary | ICD-10-CM | POA: Diagnosis not present

## 2020-12-30 DIAGNOSIS — I5022 Chronic systolic (congestive) heart failure: Secondary | ICD-10-CM | POA: Diagnosis not present

## 2021-01-03 DIAGNOSIS — E039 Hypothyroidism, unspecified: Secondary | ICD-10-CM | POA: Diagnosis not present

## 2021-01-03 DIAGNOSIS — Z6823 Body mass index (BMI) 23.0-23.9, adult: Secondary | ICD-10-CM | POA: Diagnosis not present

## 2021-01-03 DIAGNOSIS — E7800 Pure hypercholesterolemia, unspecified: Secondary | ICD-10-CM | POA: Diagnosis not present

## 2021-01-03 DIAGNOSIS — I208 Other forms of angina pectoris: Secondary | ICD-10-CM | POA: Diagnosis not present

## 2021-01-03 DIAGNOSIS — I2510 Atherosclerotic heart disease of native coronary artery without angina pectoris: Secondary | ICD-10-CM | POA: Diagnosis not present

## 2021-01-03 DIAGNOSIS — I340 Nonrheumatic mitral (valve) insufficiency: Secondary | ICD-10-CM | POA: Diagnosis not present

## 2021-01-03 DIAGNOSIS — I5022 Chronic systolic (congestive) heart failure: Secondary | ICD-10-CM | POA: Diagnosis not present

## 2021-01-03 DIAGNOSIS — I10 Essential (primary) hypertension: Secondary | ICD-10-CM | POA: Diagnosis not present

## 2021-01-03 DIAGNOSIS — R42 Dizziness and giddiness: Secondary | ICD-10-CM | POA: Diagnosis not present

## 2021-01-03 DIAGNOSIS — Z139 Encounter for screening, unspecified: Secondary | ICD-10-CM | POA: Diagnosis not present

## 2021-01-03 DIAGNOSIS — E1149 Type 2 diabetes mellitus with other diabetic neurological complication: Secondary | ICD-10-CM | POA: Diagnosis not present

## 2021-01-03 DIAGNOSIS — Z9114 Patient's other noncompliance with medication regimen: Secondary | ICD-10-CM | POA: Diagnosis not present

## 2021-01-15 DIAGNOSIS — C44529 Squamous cell carcinoma of skin of other part of trunk: Secondary | ICD-10-CM | POA: Diagnosis not present

## 2021-01-30 ENCOUNTER — Other Ambulatory Visit: Payer: Self-pay

## 2021-01-30 ENCOUNTER — Ambulatory Visit (INDEPENDENT_AMBULATORY_CARE_PROVIDER_SITE_OTHER): Payer: Medicare Other | Admitting: Podiatry

## 2021-01-30 ENCOUNTER — Encounter: Payer: Self-pay | Admitting: Podiatry

## 2021-01-30 DIAGNOSIS — M79675 Pain in left toe(s): Secondary | ICD-10-CM | POA: Diagnosis not present

## 2021-01-30 DIAGNOSIS — L84 Corns and callosities: Secondary | ICD-10-CM | POA: Diagnosis not present

## 2021-01-30 DIAGNOSIS — E1140 Type 2 diabetes mellitus with diabetic neuropathy, unspecified: Secondary | ICD-10-CM | POA: Diagnosis not present

## 2021-01-30 DIAGNOSIS — M79674 Pain in right toe(s): Secondary | ICD-10-CM | POA: Diagnosis not present

## 2021-01-30 DIAGNOSIS — B351 Tinea unguium: Secondary | ICD-10-CM

## 2021-02-02 NOTE — Progress Notes (Signed)
Subjective: Dorothy Pena is a pleasant 85 y.o. female patient seen today for calluses b/l feet and  painful thick toenails that are difficult to trim. Pain interferes with ambulation. Aggravating factors include wearing enclosed shoe gear. Pain is relieved with periodic professional debridement.  She has h/o diabetic neuropathy. She does not check her blood glucose on a daily basis. She notes no new pedal problems on today's visit.  PCP is Philmore Pali, NP. Last visit was: six months ago.  No Known Allergies  Objective: Physical Exam  General: Dorothy Pena is a pleasant 85 y.o. African American female, WD, WN in NAD. AAO x 3.   Vascular:  Capillary fill time to digits <3 seconds b/l lower extremities. Faintly palpable DP pulse(s) b/l lower extremities. Faintly palpable PT pulse(s) b/l lower extremities. Pedal hair sparse. Lower extremity skin temperature gradient within normal limits. No pain with calf compression b/l.  Dermatological:  Pedal skin with normal turgor, texture and tone b/l lower extremities. No open wounds b/l lower extremities. No interdigital macerations b/l lower extremities. Toenails 1-5 b/l elongated, discolored, dystrophic, thickened, crumbly with subungual debris and tenderness to dorsal palpation. Hyperkeratotic lesion(s) submet head 4 right foot and submet head 5 left foot.  No erythema, no edema, no drainage, no fluctuance.  Musculoskeletal:  Normal muscle strength 5/5 to all lower extremity muscle groups bilaterally. No pain crepitus or joint limitation noted with ROM b/l lower extremities. Hammertoe(s) noted to the 2-5 bilaterally.  Neurological:  Protective sensation intact 5/5 intact bilaterally with 10g monofilament b/l.  Assessment and Plan:  1. Pain due to onychomycosis of toenails of both feet   2. Callus of foot   3. Type 2 diabetes mellitus with diabetic neuropathy, without long-term current use of insulin ([place])      -Examined patient. -Continue  diabetic foot care principles: inspect feet daily, monitor glucose as recommended by PCP and/or Endocrinologist, and follow prescribed diet per PCP, Endocrinologist and/or dietician. -Patient to continue soft, supportive shoe gear daily. -Toenails 1-5 b/l were debrided in length and girth with sterile nail nippers and dremel without iatrogenic bleeding.  -Callus(es) submet head 4 right foot and submet head 5 left foot pared utilizing sterile scalpel blade without complication or incident. Total number debrided =2. -Patient to report any pedal injuries to medical professional immediately. -Patient/POA to call should there be question/concern in the interim.  Return in about 3 months (around 05/02/2021).  Marzetta Board, DPM

## 2021-02-10 DIAGNOSIS — Z9181 History of falling: Secondary | ICD-10-CM | POA: Diagnosis not present

## 2021-02-10 DIAGNOSIS — Z0000 Encounter for general adult medical examination without abnormal findings: Secondary | ICD-10-CM | POA: Diagnosis not present

## 2021-02-10 DIAGNOSIS — E785 Hyperlipidemia, unspecified: Secondary | ICD-10-CM | POA: Diagnosis not present

## 2021-03-02 DIAGNOSIS — N3001 Acute cystitis with hematuria: Secondary | ICD-10-CM | POA: Diagnosis not present

## 2021-03-02 DIAGNOSIS — R319 Hematuria, unspecified: Secondary | ICD-10-CM | POA: Diagnosis not present

## 2021-03-02 DIAGNOSIS — R300 Dysuria: Secondary | ICD-10-CM | POA: Diagnosis not present

## 2021-03-04 DIAGNOSIS — E039 Hypothyroidism, unspecified: Secondary | ICD-10-CM | POA: Diagnosis not present

## 2021-03-04 DIAGNOSIS — Z23 Encounter for immunization: Secondary | ICD-10-CM | POA: Diagnosis not present

## 2021-03-04 DIAGNOSIS — Z6823 Body mass index (BMI) 23.0-23.9, adult: Secondary | ICD-10-CM | POA: Diagnosis not present

## 2021-03-04 DIAGNOSIS — E1149 Type 2 diabetes mellitus with other diabetic neurological complication: Secondary | ICD-10-CM | POA: Diagnosis not present

## 2021-03-05 DIAGNOSIS — E1149 Type 2 diabetes mellitus with other diabetic neurological complication: Secondary | ICD-10-CM | POA: Diagnosis not present

## 2021-03-08 DIAGNOSIS — R0902 Hypoxemia: Secondary | ICD-10-CM | POA: Diagnosis not present

## 2021-03-08 DIAGNOSIS — E161 Other hypoglycemia: Secondary | ICD-10-CM | POA: Diagnosis not present

## 2021-03-08 DIAGNOSIS — I10 Essential (primary) hypertension: Secondary | ICD-10-CM | POA: Diagnosis not present

## 2021-03-08 DIAGNOSIS — E162 Hypoglycemia, unspecified: Secondary | ICD-10-CM | POA: Diagnosis not present

## 2021-03-12 ENCOUNTER — Encounter: Payer: Self-pay | Admitting: Physician Assistant

## 2021-03-12 ENCOUNTER — Other Ambulatory Visit: Payer: Self-pay

## 2021-03-12 ENCOUNTER — Ambulatory Visit: Payer: Medicare Other | Admitting: Physician Assistant

## 2021-03-12 ENCOUNTER — Other Ambulatory Visit (INDEPENDENT_AMBULATORY_CARE_PROVIDER_SITE_OTHER): Payer: Medicare Other

## 2021-03-12 VITALS — BP 134/67 | HR 61 | Resp 18 | Ht 68.0 in | Wt 142.0 lb

## 2021-03-12 DIAGNOSIS — R413 Other amnesia: Secondary | ICD-10-CM | POA: Diagnosis not present

## 2021-03-12 DIAGNOSIS — F0150 Vascular dementia without behavioral disturbance: Secondary | ICD-10-CM | POA: Diagnosis not present

## 2021-03-12 DIAGNOSIS — F4489 Other dissociative and conversion disorders: Secondary | ICD-10-CM | POA: Diagnosis not present

## 2021-03-12 DIAGNOSIS — G309 Alzheimer's disease, unspecified: Secondary | ICD-10-CM | POA: Diagnosis not present

## 2021-03-12 DIAGNOSIS — F0280 Dementia in other diseases classified elsewhere without behavioral disturbance: Secondary | ICD-10-CM

## 2021-03-12 LAB — TSH: TSH: 7.64 u[IU]/mL — ABNORMAL HIGH (ref 0.35–5.50)

## 2021-03-12 NOTE — Progress Notes (Addendum)
Assessment/Plan:   Dorothy Pena is a 85 y.o. year old female with risk factors including  age, hypertension, vitamin D deficiency, hypothyroidism coronary syndrome-C SHF, normocytic anemia, diabetes type 2, hyperlipidemia,  seen today for evaluation of memory loss.  Patient was unable to perform MoCA.  MMSE today is 19/30.  Findings are suspicious for dementia, however, in view of her recent infection, will need to reassess once she is recovered from her recent UTI, and she is more consistent with her medications, as she has been missing most of them.  Of note, most recent TSH was above 13.   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  Discussed the importance of regular daily schedule with inclusion of crossword puzzles to maintain brain function.  Continue to monitor mood with PCP.  Stay active at least 30 minutes at least 3 times a week.  Naps should be scheduled and should be no longer than 60 minutes and should not occur after 2 PM.  Mediterranean diet is recommended  Folllow up in about 3 weeks.  Subjective:    The patient is seen in neurologic consultation at the request of Lam, Rudi Rummage, NP for the evaluation of memory.  She is accompanied by her son, and her daughter on the phone to supplement the history.   The patient states that her memory has been declining "for a while ".  Her son states that this has been slowly declining for about 4 years, at which time she began to ask the same questions, and statements.  However, over the last 3 weeks, this has been worse.  She had a recent UTI, for which antibiotics were initiated.  She has been missing most of her medications, as she does not take them, and the family cannot find them.  Her PCP  is in the process of arranging these medications to be placed on a pill pack.  About 2 weeks ago, the symptoms have been worse, hallucinating, seeing people in the kitchen and in the windows, and at times, they find her in the corner, because she believes she is a child. Recently she was watching TV, and was asking her family if those people in the TV live with them.   Sometimes, she thinks that her grandchildren are her babies, having lost the sense of time.  No wandering  behavior.  She does have conversations by herself often over this last 3 weeks.  According to the family, she appears to have a lack of insight.  She also refuses to shower, and prefers not to change her clothes.  She also has shown hoarding behavior, going to [employer] and [employer], [employer], forks and knives.  No paranoia.  She does not like to eat, eats very little during the day, and has to be reminded to do so.  She does not appear to be hungry.  Denies trouble swallowing.  She does not cook at this time.  She does not sleep at night, walking throughout the night, around the house.  She sleeps during the day, denies any vivid dreams or sleepwalking.  No depression has been noted.  She ambulates without difficulty, she does not fall, or has any head injuries but she does hold onto furniture.  She refuses  to use a cane. Denies any headaches, double vision, dizziness, focal numbness or tingling, unilateral weakness or tremors, she does have urine incontinence and wets herself frequently, her daughter reports that the urine is very dark because she does not drink enough water.  She denies constipation or diarrhea, or anosmia.    The patient no longer drives. Her daughter is in charge of her finances, as mentioned above.No recent COVID.  Denies a history of OSA, alcohol or tobacco.  Family history significant for an aunt with Alzheimer's disease.    Recent TSH was 13.5 B12 is normal at 401 Hemoglobin A1c 7.6 RPR  nonreactive   No Known Allergies  Current Outpatient Medications  Medication Instructions   carvedilol (COREG) 25 mg, 2 times daily with meals   cetirizine (ZYRTEC) 20 mg, Oral, Daily   clopidogrel (PLAVIX) 75 MG tablet 1 tablet, Oral, Daily   clopidogrel (PLAVIX) 75 mg, Daily with breakfast   digoxin (LANOXIN) 0.125 mg, Daily   furosemide (LASIX) 20 MG tablet Oral   glimepiride (AMARYL) 4 mg, 2 times daily   levothyroxine (SYNTHROID) 100 mcg, Oral, Daily   levothyroxine (SYNTHROID) 112 mcg, Oral, Daily   lisinopril (ZESTRIL) 5 mg, Oral, Daily   metFORMIN (GLUCOPHAGE) 1,000 mg, 2 times daily with meals   mupirocin ointment (BACTROBAN) 2 % 1 application, Topical, Daily   nitroGLYCERIN (NITROSTAT) 0.4 MG SL tablet Sublingual   ranolazine (RANEXA) 500 MG 12 hr tablet 1 tablet, Oral, 2 times daily   rosuvastatin (CRESTOR) 20 mg, Oral, Daily   traMADol (ULTRAM) 50 mg, Oral, Every 4 hours PRN   triamcinolone cream (KENALOG) 0.5 % Topical, 2 times daily   Vitamin D (Ergocalciferol) (DRISDOL) 50,000 Units, Every 7 days     VITALS:   Vitals:   03/12/21 1330  BP: 134/67  Pulse: 61  Resp: 18  SpO2: 98%  Weight: 142 lb (64.4 kg)  Height: 5\' 8"  (1.727 m)   No flowsheet data found.  PHYSICAL EXAM   HEENT:  Normocephalic, atraumatic. The mucous membranes are moist. The superficial temporal arteries are without ropiness or tenderness. Cardiovascular: Regular rate and rhythm. Lungs: Clear to auscultation bilaterally. Neck: There are no carotid bruits noted bilaterally.  NEUROLOGICAL: No flowsheet data found. MMSE - Mini Mental State Exam 03/12/2021  Orientation to time 2  Orientation to Place 3  Registration 3  Attention/ Calculation 3  Recall 0  Language- name 2 objects 2  Language- repeat 1  Language- follow 3 step command 3  Language- read & follow direction 1  Write a sentence 1  Copy design 0  Total score 19    No flowsheet data found.   Orientation:  Alert and  oriented  person.  Is a year 20,000, fall, unknown date, is with Wednesday, and the month is May.  She does note that is [place], [place] in [place].  Delayed recall 0 of 3 able to name objects and repeat phrases.  Unable to copy picture.  No aphasia or dysarthria. Fund of knowledge is reduced. Recent  and remote memory impaired Attention and concentration are normal.   Cranial nerves: There is good facial symmetry. Extraocular muscles are intact and visual fields are full to confrontational testing. Speech is fluent and clear. Soft palate rises symmetrically and there is no tongue deviation. Hearing is intact to conversational tone. Tone: Tone is good throughout. Sensation: Sensation is intact to light touch and pinprick throughout. Vibration is intact at the bilateral big toe.There is no extinction  with double simultaneous stimulation. There is no sensory dermatomal level identified. Coordination: The patient has no difficulty with RAM's or FNF bilaterally. Normal finger to nose  Motor: Strength is 5/5 in the bilateral upper and lower extremities. There is no pronator drift. There are no fasciculations noted. DTR's: Deep tendon reflexes are 2/4 at the bilateral biceps, triceps, brachioradialis, patella and achilles.  Plantar responses are downgoing bilaterally. Gait and Station: The patient is able to ambulate without difficulty.The patient is able to heel toe walk without any difficulty.The patient is able to ambulate in a tandem fashion. The patient is able to stand in the Romberg position.     Thank you for allowing [place] the opportunity to participate in the care of this nice patient. Please do not hesitate to contact us for any questions or concerns.   Total time spent on today's visit was  60 minutes, including both face-to-face time and nonface-to-face time.  Time included that spent on review of records (prior notes available to me/labs/imaging if pertinent), discussing  treatment and goals, answering patient's questions and coordinating care.  Cc:  Philmore Pali, NP  Sharene Butters 03/12/2021 3:20 PM

## 2021-03-12 NOTE — Patient Instructions (Addendum)
It was a pleasure to see you today at our office.   Recommendations:  MRI of the brain, the radiology office will call you to arrange you appointment Check labs today Follow up  Oct 12 at 9:30 am   RECOMMENDATIONS FOR ALL PATIENTS WITH MEMORY PROBLEMS: 1. Continue to exercise (Recommend 30 minutes of walking everyday, or 3 hours every week) 2. Increase social interactions - continue going to [place] and enjoy social gatherings with friends and family 3. Eat healthy, avoid fried foods and eat more fruits and vegetables 4. Maintain adequate blood pressure, blood sugar, and blood cholesterol level. Reducing the risk of stroke and cardiovascular disease also helps promoting better memory. 5. Avoid stressful situations. Live a simple life and avoid aggravations. Organize your time and prepare for the next day in anticipation. 6. Sleep well, avoid any interruptions of sleep and avoid any distractions in the bedroom that may interfere with adequate sleep quality 7. Avoid sugar, avoid sweets as there is a strong link between excessive sugar intake, diabetes, and cognitive impairment We discussed the Mediterranean diet, which has been shown to help patients reduce the risk of progressive memory disorders and reduces cardiovascular risk. This includes eating fish, eat fruits and green leafy vegetables, nuts like almonds and hazelnuts, walnuts, and also use olive oil. Avoid fast foods and fried foods as much as possible. Avoid sweets and sugar as sugar use has been linked to worsening of memory function.  There is always a concern of gradual progression of memory problems. If this is the case, then we may need to adjust level of care according to patient needs. Support, both to the patient and caregiver, should then be put into place.      You have been referred for a neuropsychological evaluation (i.e., evaluation of memory and thinking abilities). Please bring someone with you to this appointment if  possible, as it is helpful for the doctor to hear from both you and another adult who knows you well. Please bring eyeglasses and hearing aids if you wear them.    The evaluation will take approximately 3 hours and has two parts:   The first part is a clinical interview with the neuropsychologist (Dr. Melvyn Novas or Dr. Nicole Kindred). During the interview, the neuropsychologist will speak with you and the individual you brought to the appointment.    The second part of the evaluation is testing with the doctor's technician Hinton Dyer or Maudie Mercury). During the testing, the technician will ask you to remember different types of material, solve problems, and answer some questionnaires. Your family member will not be present for this portion of the evaluation.   Please note: We must reserve several hours of the neuropsychologist's time and the psychometrician's time for your evaluation appointment. As such, there is a No-Show fee of $100. If you are unable to attend any of your appointments, please contact our office as soon as possible to reschedule.    FALL PRECAUTIONS: Be cautious when walking. Scan the area for obstacles that may increase the risk of trips and falls. When getting up in the mornings, sit up at the edge of the bed for a few minutes before getting out of bed. Consider elevating the bed at the head end to avoid drop of blood pressure when getting up. Walk always in a well-lit room (use night lights in the walls). Avoid area rugs or power cords from appliances in the middle of the walkways. Use a walker or a cane if necessary and consider  physical therapy for balance exercise. Get your eyesight checked regularly.  FINANCIAL OVERSIGHT: Supervision, especially oversight when making financial decisions or transactions is also recommended.  HOME SAFETY: Consider the safety of the kitchen when operating appliances like stoves, microwave oven, and blender. Consider having supervision and share cooking responsibilities  until no longer able to participate in those. Accidents with firearms and other hazards in the house should be identified and addressed as well.   ABILITY TO BE LEFT ALONE: If patient is unable to contact 911 operator, consider using LifeLine, or when the need is there, arrange for someone to stay with patients. Smoking is a fire hazard, consider supervision or cessation. Risk of wandering should be assessed by caregiver and if detected at any point, supervision and safe proof recommendations should be instituted.  MEDICATION SUPERVISION: Inability to self-administer medication needs to be constantly addressed. Implement a mechanism to ensure safe administration of the medications.   DRIVING: Regarding driving, in patients with progressive memory problems, driving will be impaired. We advise to have someone else do the driving if trouble finding directions or if minor accidents are reported. Independent driving assessment is available to determine safety of driving.   If you are interested in the driving assessment, you can contact the following:  [employer] in [place]  [place] [phone] or [phone]    [place] refers to food and lifestyle choices that are based on the traditions of countries located on the [employer]. This way of eating has been shown to help prevent certain conditions and improve outcomes for people who have chronic diseases, like kidney disease and heart disease. What are tips for following this plan? Lifestyle  Cook and eat meals together with your family, when possible. Drink enough fluid to keep your urine clear or pale yellow. Be physically active every day. This includes: Aerobic exercise like running or swimming. Leisure activities like gardening, walking, or housework. Get 7-8 hours of sleep each  night. If recommended by your health care provider, drink red wine in moderation. This means 1 glass a day for nonpregnant women and 2 glasses a day for men. A glass of wine equals 5 oz (150 mL). Reading food labels  Check the serving size of packaged foods. For foods such as rice and pasta, the serving size refers to the amount of cooked product, not dry. Check the total fat in packaged foods. Avoid foods that have saturated fat or trans fats. Check the ingredients list for added sugars, such as corn syrup. Shopping  At the grocery store, buy most of your food from the areas near the walls of the store. This includes: Fresh fruits and vegetables (produce). Grains, beans, nuts, and seeds. Some of these may be available in unpackaged forms or large amounts (in bulk). Fresh seafood. Poultry and eggs. Low-fat dairy products. Buy whole ingredients instead of prepackaged foods. Buy fresh fruits and vegetables in-season from local farmers markets. Buy frozen fruits and vegetables in resealable bags. If you do not have access to quality fresh seafood, buy precooked frozen shrimp or canned fish, such as tuna, salmon, or sardines. Buy small amounts of raw or cooked vegetables, salads, or olives from the deli or salad bar at your store. Stock your pantry so you always have certain foods on hand, such as olive oil, canned tuna, canned tomatoes, rice, pasta, and beans. Cooking  Cook foods with extra-virgin olive  oil instead of using butter or other vegetable oils. Have meat as a side dish, and have vegetables or grains as your main dish. This means having meat in small portions or adding small amounts of meat to foods like pasta or stew. Use beans or vegetables instead of meat in common dishes like chili or lasagna. Experiment with different cooking methods. Try roasting or broiling vegetables instead of steaming or sauteing them. Add frozen vegetables to soups, stews, pasta, or rice. Add nuts or seeds  for added healthy fat at each meal. You can add these to yogurt, salads, or vegetable dishes. Marinate fish or vegetables using olive oil, lemon juice, garlic, and fresh herbs. Meal planning  Plan to eat 1 vegetarian meal one day each week. Try to work up to 2 vegetarian meals, if possible. Eat seafood 2 or more times a week. Have healthy snacks readily available, such as: Vegetable sticks with hummus. Greek yogurt. Fruit and nut trail mix. Eat balanced meals throughout the week. This includes: Fruit: 2-3 servings a day Vegetables: 4-5 servings a day Low-fat dairy: 2 servings a day Fish, poultry, or lean meat: 1 serving a day Beans and legumes: 2 or more servings a week Nuts and seeds: 1-2 servings a day Whole grains: 6-8 servings a day Extra-virgin olive oil: 3-4 servings a day Limit red meat and sweets to only a few servings a month What are my food choices? Mediterranean diet Recommended Grains: Whole-grain pasta. Brown rice. Bulgar wheat. Polenta. Couscous. Whole-wheat bread. [place]. Vegetables: Artichokes. Beets. Broccoli. Cabbage. Carrots. Eggplant. Green beans. Chard. Kale. Spinach. Onions. Leeks. Peas. Squash. Tomatoes. Peppers. Radishes. Fruits: Apples. Apricots. Avocado. Berries. Bananas. Cherries. Dates. Figs. Grapes. Lemons. Melon. Oranges. Peaches. Plums. Pomegranate. Meats and other protein foods: Beans. Almonds. Sunflower seeds. Pine nuts. Peanuts. [place]. Salmon. Scallops. Shrimp. [place]. Tilapia. Clams. Oysters. Eggs. Dairy: Low-fat milk. Cheese. Greek yogurt. Beverages: Water. Red wine. Herbal tea. Fats and oils: Extra virgin olive oil. Avocado oil. Grape seed oil. Sweets and desserts: [place] yogurt with honey. Baked apples. Poached pears. Trail mix. Seasoning and other foods: Basil. Cilantro. Coriander. Cumin. Mint. Parsley. Sage. Rosemary. Tarragon. Garlic. Oregano. Thyme. Pepper. Balsalmic vinegar. Tahini. Hummus. Tomato sauce. Olives. Mushrooms. Limit  these Grains: Prepackaged pasta or rice dishes. Prepackaged cereal with added sugar. Vegetables: Deep fried potatoes (french fries). Fruits: Fruit canned in syrup. Meats and other protein foods: Beef. Pork. Lamb. Poultry with skin. Hot dogs. Berniece Salines. Dairy: Ice cream. Sour cream. Whole milk. Beverages: Juice. Sugar-sweetened soft drinks. Beer. Liquor and spirits. Fats and oils: Butter. [place]ola oil. Vegetable oil. Beef fat (tallow). Lard. Sweets and desserts: Cookies. Cakes. Pies. Candy. Seasoning and other foods: Mayonnaise. Premade sauces and marinades. The items listed may not be a complete list. Talk with your dietitian about what dietary choices are right for you. Summary The Mediterranean diet includes both food and lifestyle choices. Eat a variety of fresh fruits and vegetables, beans, nuts, seeds, and whole grains. Limit the amount of red meat and sweets that you eat. Talk with your health care provider about whether it is safe for you to drink red wine in moderation. This means 1 glass a day for nonpregnant women and 2 glasses a day for men. A glass of wine equals 5 oz (150 mL). This information is not intended to replace advice given to you by your health care provider. Make sure you discuss any questions you have with your health care provider. Document Released: 01/30/2016 Document Revised: 03/03/2016 Document Reviewed: 01/30/2016  Chartered certified accountant Patient Education  [employer].

## 2021-03-13 LAB — URINALYSIS W MICROSCOPIC + REFLEX CULTURE
Bacteria, UA: NONE SEEN /HPF
Bilirubin Urine: NEGATIVE
Glucose, UA: NEGATIVE
Hgb urine dipstick: NEGATIVE
Hyaline Cast: NONE SEEN /LPF
Ketones, ur: NEGATIVE
Leukocyte Esterase: NEGATIVE
Nitrites, Initial: NEGATIVE
Protein, ur: NEGATIVE
RBC / HPF: NONE SEEN /HPF (ref 0–2)
Specific Gravity, Urine: 1.016 (ref 1.001–1.035)
WBC, UA: NONE SEEN /HPF (ref 0–5)
pH: 6.5 (ref 5.0–8.0)

## 2021-03-13 LAB — NO CULTURE INDICATED

## 2021-03-14 NOTE — Progress Notes (Signed)
No answer at 10:22am. Will call back later

## 2021-03-17 DIAGNOSIS — E11649 Type 2 diabetes mellitus with hypoglycemia without coma: Secondary | ICD-10-CM | POA: Diagnosis not present

## 2021-03-17 DIAGNOSIS — I6523 Occlusion and stenosis of bilateral carotid arteries: Secondary | ICD-10-CM | POA: Diagnosis not present

## 2021-03-17 DIAGNOSIS — R404 Transient alteration of awareness: Secondary | ICD-10-CM | POA: Diagnosis not present

## 2021-03-17 DIAGNOSIS — E161 Other hypoglycemia: Secondary | ICD-10-CM | POA: Diagnosis not present

## 2021-03-17 DIAGNOSIS — E079 Disorder of thyroid, unspecified: Secondary | ICD-10-CM | POA: Diagnosis not present

## 2021-03-17 DIAGNOSIS — I5022 Chronic systolic (congestive) heart failure: Secondary | ICD-10-CM | POA: Diagnosis not present

## 2021-03-17 DIAGNOSIS — Z87891 Personal history of nicotine dependence: Secondary | ICD-10-CM | POA: Diagnosis not present

## 2021-03-17 DIAGNOSIS — E162 Hypoglycemia, unspecified: Secondary | ICD-10-CM | POA: Diagnosis not present

## 2021-03-17 DIAGNOSIS — E7800 Pure hypercholesterolemia, unspecified: Secondary | ICD-10-CM | POA: Diagnosis not present

## 2021-03-17 DIAGNOSIS — R410 Disorientation, unspecified: Secondary | ICD-10-CM | POA: Diagnosis not present

## 2021-03-17 DIAGNOSIS — I10 Essential (primary) hypertension: Secondary | ICD-10-CM | POA: Diagnosis not present

## 2021-03-17 DIAGNOSIS — Z7984 Long term (current) use of oral hypoglycemic drugs: Secondary | ICD-10-CM | POA: Diagnosis not present

## 2021-03-17 DIAGNOSIS — Z743 Need for continuous supervision: Secondary | ICD-10-CM | POA: Diagnosis not present

## 2021-03-17 DIAGNOSIS — E785 Hyperlipidemia, unspecified: Secondary | ICD-10-CM | POA: Diagnosis not present

## 2021-03-17 DIAGNOSIS — Z7902 Long term (current) use of antithrombotics/antiplatelets: Secondary | ICD-10-CM | POA: Diagnosis not present

## 2021-03-17 DIAGNOSIS — R0902 Hypoxemia: Secondary | ICD-10-CM | POA: Diagnosis not present

## 2021-03-17 DIAGNOSIS — G3184 Mild cognitive impairment, so stated: Secondary | ICD-10-CM | POA: Diagnosis not present

## 2021-03-17 DIAGNOSIS — I110 Hypertensive heart disease with heart failure: Secondary | ICD-10-CM | POA: Diagnosis not present

## 2021-04-02 ENCOUNTER — Encounter: Payer: Self-pay | Admitting: Physician Assistant

## 2021-04-02 ENCOUNTER — Other Ambulatory Visit: Payer: Self-pay

## 2021-04-02 ENCOUNTER — Ambulatory Visit: Payer: Medicare Other | Admitting: Physician Assistant

## 2021-04-02 VITALS — BP 172/103 | HR 65 | Resp 18 | Ht 68.0 in | Wt 135.0 lb

## 2021-04-02 DIAGNOSIS — G309 Alzheimer's disease, unspecified: Secondary | ICD-10-CM | POA: Diagnosis not present

## 2021-04-02 DIAGNOSIS — F0150 Vascular dementia without behavioral disturbance: Secondary | ICD-10-CM | POA: Diagnosis not present

## 2021-04-02 DIAGNOSIS — R413 Other amnesia: Secondary | ICD-10-CM

## 2021-04-02 DIAGNOSIS — F0280 Dementia in other diseases classified elsewhere without behavioral disturbance: Secondary | ICD-10-CM

## 2021-04-02 MED ORDER — DONEPEZIL HCL 10 MG PO TABS: ORAL_TABLET | ORAL | 11 refills | Status: DC

## 2021-04-02 NOTE — Progress Notes (Signed)
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  MRI of the brain is scheduled for tomorrow.  Latest TSH on 03/12/2021 is 7.64, improved from prior.  Exam is unremarkable.  Recommendations  Discussed safety both in and out of the home.  Discussed the importance of regular daily schedule  to maintain brain function.  Continue to monitor mood by PCP Follow-up the results of the MRI of the brain Stay active at least 30 minutes at least 3 times a week.  Naps should be scheduled and should be no longer than 60 minutes and should not occur after 2 PM.  Mediterranean diet is recommended  Start donepezil 10 mg daily.  Start with half tablet, 5 mg p.o. nightly, and then increase to 10 mg (1 tab) after 2 weeks.  Side effects were discussed Check B12 Follow up in 6 months.   Case discussed with Dr. Delice Lesch who agrees with the plan     Subjective:     Dorothy Pena is a 85 y.o. female with a history of hypertension, vitamin D deficiency, hypothyroidism, chronic systolic heart failure, normocytic anemia, diabetes type 2, hyperlipidemia, seen today in follow up for memory loss.  She had been seen on 03/12/2021, at which time she had been treated for UTI, with associated mental status changes.  She is being reevaluated today.  MMSE at that time was 19/30, with findings suspicious for dementia.  Of note, the TSH at the time was above 13 as well.  This patient is accompanied in the office by her son who supplements the history.  Previous records as well as any outside records available were reviewed prior to todays visit.   She had another presentation to the emergency department on 03/17/2021 with hypoglycemia, with a glucose of 37, improved with D10. In today's visit, the patient's memory is about the same, declining for the last 4 years, continuing to repeat the same questions and statements.  However, her confusional state has improved.  Her  mood is improved, denies hallucinations or paranoia, son agrees.  No wandering behavior.  She does have conversations with herself.  She continues to refuse to shower, prefers not to change her clothes.  She continues to have hoarding behavior, going to [place] on a regular basis, collecting lamps, forks, knives, "anything that I like ".  No paranoia.  Her appetite is "too good ".  Denies trouble swallowing.  She does not cook.  Her sleep has improved, although she does wake up occasionally at night, and walks around the house.  Denies any vivid dreams.  She ambulates without difficulty, though without the use of a walker or a cane.  No falls are reported.  No head injuries.  She refuses to use a cane.  She denies any headaches, double vision, dizziness, focal numbness or tingling, unilateral weakness or tremors.  She continues to have urine incontinence, and wears pads.  Labs 03/12/21  TSH 7.64 UA neg   PREVIOUS MEDICATIONS:   CURRENT MEDICATIONS:  Outpatient Encounter Medications as of 04/02/2021  Medication Sig   carvedilol (COREG) 25 MG tablet Take 25 mg by mouth 2 (two) times daily with a meal.   cetirizine (ZYRTEC) 10 MG tablet Take 20 mg by mouth daily.   clopidogrel (PLAVIX) 75 MG tablet Take 75 mg by mouth daily with breakfast.   clopidogrel (PLAVIX) 75 MG tablet Take 1 tablet by mouth daily.  digoxin (LANOXIN) 0.125 MG tablet Take 0.125 mg by mouth daily.   furosemide (LASIX) 20 MG tablet Take by mouth.   glimepiride (AMARYL) 4 MG tablet Take 4 mg by mouth 2 (two) times daily.   levothyroxine (SYNTHROID) 100 MCG tablet Take 100 mcg by mouth daily.   levothyroxine (SYNTHROID) 112 MCG tablet Take 112 mcg by mouth daily.   lisinopril (ZESTRIL) 10 MG tablet Take 5 mg by mouth daily.   metFORMIN (GLUCOPHAGE) 1000 MG tablet Take 1,000 mg by mouth 2 (two) times daily with a meal.   mupirocin ointment (BACTROBAN) 2 % Apply 1 application topically daily.   nitroGLYCERIN  (NITROSTAT) 0.4 MG SL tablet Place under the tongue.   ranolazine (RANEXA) 500 MG 12 hr tablet Take 1 tablet by mouth 2 (two) times daily.   rosuvastatin (CRESTOR) 20 MG tablet Take 1 tablet (20 mg total) by mouth daily.   traMADol (ULTRAM) 50 MG tablet Take 50 mg by mouth every 4 (four) hours as needed.   triamcinolone cream (KENALOG) 0.5 % Apply topically 2 (two) times daily.   Vitamin D, Ergocalciferol, (DRISDOL) 50000 UNITS CAPS capsule Take 50,000 Units by mouth every 7 (seven) days. On Sunday   No facility-administered encounter medications on file as of 04/02/2021.     Objective:     PHYSICAL EXAMINATION:    VITALS:   Vitals:   04/02/21 0931  BP: (!) 172/103  Pulse: 65  Resp: 18  SpO2: 96%  Weight: 135 lb (61.2 kg)  Height: 5\' 8"  (1.727 m)    GEN:  The patient appears stated age and is in NAD. HEENT:  Normocephalic, atraumatic.   Neurological examination:  General: NAD, well-groomed, appears stated age. Orientation: The patient is alert. Oriented to person, place and date Cranial nerves: There is good facial symmetry.The speech is fluent and clear. No aphasia or dysarthria. Fund of knowledge is appropriate. Recent and remote memory are impaired. Attention and concentration are reduced.  Able to name objects and repeat phrases.  Hearing is intact to conversational tone.    Sensation: Sensation is intact to light touch throughout Motor: Strength is at least antigravity x4. Tremors: none  DTR's 2/4 in UE/LE    No flowsheet data found. MMSE - Mini Mental State Exam 03/12/2021  Orientation to time 2  Orientation to Place 3  Registration 3  Attention/ Calculation 3  Recall 0  Language- name 2 objects 2  Language- repeat 1  Language- follow 3 step command 3  Language- read & follow direction 1  Write a sentence 1  Copy design 0  Total score 19    No flowsheet data found.     Movement examination: Tone: There is normal tone in the UE/LE Abnormal movements:   no tremor.  No myoclonus.  No asterixis.   Coordination:  There is no decremation with RAM's. Normal finger to nose  Gait and Station: The patient has no difficulty arising out of a deep-seated chair without the use of the hands. The patient's stride length is good.  Gait is cautious and narrow.     Total time spent on today's visit was 30  minutes, including both face-to-face time and nonface-to-face time. Time included that spent on review of records (prior notes available to me/labs/imaging if pertinent), discussing treatment and goals, answering patient's questions and coordinating care.  Cc:  Philmore Pali, NP Sharene Butters, PA-C

## 2021-04-02 NOTE — Patient Instructions (Signed)
It was a pleasure to see you today at our office.   Recommendations:  Follow up in  6 months We will start donepezil half tablet (5mg ) daily for 2  weeks.  If you are tolerating the medication, then after 2 weeks, we will increase the dose to a full tablet of 10 mg daily.  Side effects include nausea, vomiting, diarrhea, vivid dreams, and muscle cramps.  Please call the clinic if you experience any of these symptoms.    RECOMMENDATIONS FOR ALL PATIENTS WITH MEMORY PROBLEMS: 1. Continue to exercise (Recommend 30 minutes of walking everyday, or 3 hours every week) 2. Increase social interactions - continue going to [place] and enjoy social gatherings with friends and family 3. Eat healthy, avoid fried foods and eat more fruits and vegetables 4. Maintain adequate blood pressure, blood sugar, and blood cholesterol level. Reducing the risk of stroke and cardiovascular disease also helps promoting better memory. 5. Avoid stressful situations. Live a simple life and avoid aggravations. Organize your time and prepare for the next day in anticipation. 6. Sleep well, avoid any interruptions of sleep and avoid any distractions in the bedroom that may interfere with adequate sleep quality 7. Avoid sugar, avoid sweets as there is a strong link between excessive sugar intake, diabetes, and cognitive impairment We discussed the Mediterranean diet, which has been shown to help patients reduce the risk of progressive memory disorders and reduces cardiovascular risk. This includes eating fish, eat fruits and green leafy vegetables, nuts like almonds and hazelnuts, walnuts, and also use olive oil. Avoid fast foods and fried foods as much as possible. Avoid sweets and sugar as sugar use has been linked to worsening of memory function.  There is always a concern of gradual progression of memory problems. If this is the case, then we may need to adjust level of care according to patient needs. Support, both to the  patient and caregiver, should then be put into place.    FALL PRECAUTIONS: Be cautious when walking. Scan the area for obstacles that may increase the risk of trips and falls. When getting up in the mornings, sit up at the edge of the bed for a few minutes before getting out of bed. Consider elevating the bed at the head end to avoid drop of blood pressure when getting up. Walk always in a well-lit room (use night lights in the walls). Avoid area rugs or power cords from appliances in the middle of the walkways. Use a walker or a cane if necessary and consider physical therapy for balance exercise. Get your eyesight checked regularly.  FINANCIAL OVERSIGHT: Supervision, especially oversight when making financial decisions or transactions is also recommended.  HOME SAFETY: Consider the safety of the kitchen when operating appliances like stoves, microwave oven, and blender. Consider having supervision and share cooking responsibilities until no longer able to participate in those. Accidents with firearms and other hazards in the house should be identified and addressed as well.   ABILITY TO BE LEFT ALONE: If patient is unable to contact 911 operator, consider using LifeLine, or when the need is there, arrange for someone to stay with patients. Smoking is a fire hazard, consider supervision or cessation. Risk of wandering should be assessed by caregiver and if detected at any point, supervision and safe proof recommendations should be instituted.  MEDICATION SUPERVISION: Inability to self-administer medication needs to be constantly addressed. Implement a mechanism to ensure safe administration of the medications.   DRIVING: Regarding driving, in patients with  progressive memory problems, driving will be impaired. We advise to have someone else do the driving if trouble finding directions or if minor accidents are reported. Independent driving assessment is available to determine safety of  driving.   If you are interested in the driving assessment, you can contact the following:  [employer] in [place]  [place] [phone]  [place]  [facility] [phone] or [phone]

## 2021-04-03 DIAGNOSIS — F0280 Dementia in other diseases classified elsewhere without behavioral disturbance: Secondary | ICD-10-CM | POA: Insufficient documentation

## 2021-04-03 DIAGNOSIS — F0150 Vascular dementia without behavioral disturbance: Secondary | ICD-10-CM | POA: Insufficient documentation

## 2021-04-03 DIAGNOSIS — G309 Alzheimer's disease, unspecified: Secondary | ICD-10-CM | POA: Insufficient documentation

## 2021-04-07 ENCOUNTER — Other Ambulatory Visit: Payer: Self-pay

## 2021-04-07 ENCOUNTER — Ambulatory Visit
Admission: RE | Admit: 2021-04-07 | Discharge: 2021-04-07 | Disposition: A | Discharge status: Home / Self Care | Payer: Medicare Other | Source: Ambulatory Visit | Attending: Physician Assistant | Admitting: Physician Assistant

## 2021-04-07 DIAGNOSIS — I619 Nontraumatic intracerebral hemorrhage, unspecified: Secondary | ICD-10-CM | POA: Diagnosis not present

## 2021-04-07 DIAGNOSIS — G319 Degenerative disease of nervous system, unspecified: Secondary | ICD-10-CM | POA: Diagnosis not present

## 2021-04-07 DIAGNOSIS — I6782 Cerebral ischemia: Secondary | ICD-10-CM | POA: Diagnosis not present

## 2021-04-07 DIAGNOSIS — R413 Other amnesia: Secondary | ICD-10-CM | POA: Diagnosis not present

## 2021-05-22 ENCOUNTER — Encounter: Payer: Self-pay | Admitting: Podiatry

## 2021-05-22 ENCOUNTER — Ambulatory Visit: Payer: Medicare Other | Admitting: Podiatry

## 2021-05-22 DIAGNOSIS — M2041 Other hammer toe(s) (acquired), right foot: Secondary | ICD-10-CM | POA: Diagnosis not present

## 2021-05-22 DIAGNOSIS — M2011 Hallux valgus (acquired), right foot: Secondary | ICD-10-CM | POA: Diagnosis not present

## 2021-05-22 DIAGNOSIS — M79675 Pain in left toe(s): Secondary | ICD-10-CM

## 2021-05-22 DIAGNOSIS — E119 Type 2 diabetes mellitus without complications: Secondary | ICD-10-CM

## 2021-05-22 DIAGNOSIS — E1151 Type 2 diabetes mellitus with diabetic peripheral angiopathy without gangrene: Secondary | ICD-10-CM | POA: Diagnosis not present

## 2021-05-22 DIAGNOSIS — M2012 Hallux valgus (acquired), left foot: Secondary | ICD-10-CM

## 2021-05-22 DIAGNOSIS — B351 Tinea unguium: Secondary | ICD-10-CM

## 2021-05-22 DIAGNOSIS — M2042 Other hammer toe(s) (acquired), left foot: Secondary | ICD-10-CM | POA: Diagnosis not present

## 2021-05-22 DIAGNOSIS — L84 Corns and callosities: Secondary | ICD-10-CM

## 2021-05-22 DIAGNOSIS — M79674 Pain in right toe(s): Secondary | ICD-10-CM | POA: Diagnosis not present

## 2021-05-22 NOTE — Progress Notes (Signed)
ANNUAL DIABETIC FOOT EXAM  Subjective: Dorothy Pena presents today for for annual diabetic foot examination, at risk foot care with history of diabetic neuropathy, and callus(es) of both feet and painful thick toenails that are difficult to trim. Painful toenails interfere with ambulation. Aggravating factors include wearing enclosed shoe gear. Pain is relieved with periodic professional debridement. Painful calluses are aggravated when weightbearing with and without shoegear. Pain is relieved with periodic professional debridement..  Patient relates 8 year h/o diabetes.  Patient denies any h/o foot wounds.  Patient denies any numbness, tingling, burning, or pins/needle sensation in feet.  Patient does not monitor blood glucose daily.  Philmore Pali, NP is patient's PCP. Last visit was May 03, 2020.  Past Medical History:  Diagnosis Date   Diabetes mellitus without complication ([place])    Hypertension    Intermediate coronary syndrome (HCC)    MI (myocardial infarction) ([place]) 2009   1 stent   Patient Active Problem List   Diagnosis Date Noted   Mixed Alzheimer's and vascular dementia ([place]) 04/03/2021   Bilateral carotid artery stenosis 61/60/7371   Chronic systolic congestive heart failure ([place]) 06/25/2017   Hyperlipidemia 06/25/2017   Diabetes mellitus due to underlying condition with unspecified complications ([place]) 12/16/9483   Ischemic cardiomyopathy 02/27/2016   Non-rheumatic mitral regurgitation 03/22/2014   Syncope 12/12/2013   Chest pain 12/12/2013   CAD (coronary artery disease) 12/12/2013   Dyslipidemia 12/12/2013   Essential hypertension, benign 12/12/2013   Orthostatic hypotension 12/12/2013   Coronary artery disease involving native coronary artery with angina pectoris ([place]) 12/12/2013   UTI (urinary tract infection) 12/12/2013   Normocytic anemia 12/12/2013   Intermediate coronary syndrome [facility])    Past Surgical History:  Procedure Laterality Date   CAROTID  STENT INSERTION     FRACTIONAL FLOW RESERVE WIRE  12/12/2013   Procedure: FRACTIONAL FLOW RESERVE WIRE;  Surgeon: Jettie Booze, MD;  Location: [facility] CATH LAB;  Service: Cardiovascular;;   LEFT HEART CATHETERIZATION WITH CORONARY ANGIOGRAM N/A 12/12/2013   Procedure: LEFT HEART CATHETERIZATION WITH CORONARY ANGIOGRAM;  Surgeon: Jettie Booze, MD;  Location: [facility] CATH LAB;  Service: Cardiovascular;  Laterality: N/A;   Current Outpatient Medications on File Prior to Visit  Medication Sig Dispense Refill   carvedilol (COREG) 25 MG tablet Take 25 mg by mouth 2 (two) times daily with a meal.     cetirizine (ZYRTEC) 10 MG tablet Take 20 mg by mouth daily.     clopidogrel (PLAVIX) 75 MG tablet Take 75 mg by mouth daily with breakfast.     clopidogrel (PLAVIX) 75 MG tablet Take 1 tablet by mouth daily.     digoxin (LANOXIN) 0.125 MG tablet Take 0.125 mg by mouth daily.     donepezil (ARICEPT) 10 MG tablet Take half tablet (5 mg) daily for 2 weeks, then increase to the full tablet at 10 mg daily 30 tablet 11   furosemide (LASIX) 20 MG tablet Take by mouth.     glimepiride (AMARYL) 4 MG tablet Take 4 mg by mouth 2 (two) times daily.     levothyroxine (SYNTHROID) 100 MCG tablet Take 100 mcg by mouth daily.     levothyroxine (SYNTHROID) 112 MCG tablet Take 112 mcg by mouth daily.     lisinopril (ZESTRIL) 10 MG tablet Take 5 mg by mouth daily.     metFORMIN (GLUCOPHAGE) 1000 MG tablet Take 1,000 mg by mouth 2 (two) times daily with a meal.     mupirocin ointment (BACTROBAN) 2 %  Apply 1 application topically daily.     nitroGLYCERIN (NITROSTAT) 0.4 MG SL tablet Place under the tongue.     ranolazine (RANEXA) 500 MG 12 hr tablet Take 1 tablet by mouth 2 (two) times daily.     rosuvastatin (CRESTOR) 20 MG tablet Take 1 tablet (20 mg total) by mouth daily. 30 tablet 0   traMADol (ULTRAM) 50 MG tablet Take 50 mg by mouth every 4 (four) hours as needed.     triamcinolone cream (KENALOG) 0.5 % Apply  topically 2 (two) times daily.     Vitamin D, Ergocalciferol, (DRISDOL) 50000 UNITS CAPS capsule Take 50,000 Units by mouth every 7 (seven) days. On Sunday     No current facility-administered medications on file prior to visit.    No Known Allergies Social History   Occupational History   Not on file  Tobacco Use   Smoking status: Former    Types: Cigarettes    Quit date: 06/23/2007    Years since quitting: 13.9   Smokeless tobacco: Never  Substance and Sexual Activity   Alcohol use: No   Drug use: No   Sexual activity: Not on file   History reviewed. No pertinent family history. Immunization History  Administered Date(s) Administered   PFIZER(Purple Top)SARS-COV-2 Vaccination 08/24/2019, 09/19/2019     Review of Systems: Negative except as noted in the HPI.   Objective: There were no vitals filed for this visit.  Dorothy Pena is a pleasant 85 y.o. female in NAD. AAO X 3.  Vascular Examination: CFT <3 seconds b/l LE. Faintly palpable pedal pulses b/l LE. Pedal hair sparse b/l LE. Skin temperature gradient WNL b/l. No pain with calf compression b/l. No edema b/l LE. No cyanosis or clubbing noted b/l LE.  Dermatological Examination: Pedal skin thin, shiny and atrophic b/l LE. No open wounds b/l LE. No interdigital macerations noted b/l LE. Toenails 1-5 b/l elongated, discolored, dystrophic, thickened, crumbly with subungual debris and tenderness to dorsal palpation. Hyperkeratotic lesion(s) submet head 1 b/l, submet head 4 right foot, and submet head 5 left foot.  No erythema, no edema, no drainage, no fluctuance.  Musculoskeletal Examination: Muscle strength 5/5 to all lower extremity muscle groups bilaterally. HAV with bunion deformity noted b/l LE. Hammertoe deformity noted 2-5 b/l. Utilizes cane for ambulation assistance.  Footwear Assessment: Does the patient wear appropriate shoes? Yes. Does the patient need inserts/orthotics? Yes..  Neurological Examination: Pt has  subjective symptoms of neuropathy. Protective sensation intact 5/5 intact bilaterally with 10g monofilament b/l. Vibratory sensation intact b/l.  Assessment: 1. Pain due to onychomycosis of toenails of both feet   2. Callus of foot   3. Hallux valgus, acquired, bilateral   4. Acquired hammertoes of both feet   5. Type II diabetes mellitus with peripheral circulatory disorder (HCC)   6. Encounter for diabetic foot exam ([place])     ADA Risk Categorization:  High Risk  Patient has one or more of the following: Loss of protective sensation Absent pedal pulses Severe Foot deformity History of foot ulcer  Plan: -Diabetic foot examination performed today. -Continue diabetic foot care principles: inspect feet daily, monitor glucose as recommended by PCP and/or Endocrinologist, and follow prescribed diet per PCP, Endocrinologist and/or dietician. -Mycotic toenails 1-5 bilaterally were debrided in length and girth with sterile nail nippers and dremel without incident. -Callus(es) submet head 1 b/l, submet head 4 right foot, and submet head 5 left foot pared utilizing sterile scalpel blade without complication or incident. Total number debrided =  4. -Patient/POA to call should there be question/concern in the interim.  Return in about 3 months (around 08/20/2021).  Marzetta Board, DPM

## 2021-06-02 DIAGNOSIS — Z6820 Body mass index (BMI) 20.0-20.9, adult: Secondary | ICD-10-CM | POA: Diagnosis not present

## 2021-06-02 DIAGNOSIS — Z01818 Encounter for other preprocedural examination: Secondary | ICD-10-CM | POA: Diagnosis not present

## 2021-06-02 DIAGNOSIS — H269 Unspecified cataract: Secondary | ICD-10-CM | POA: Diagnosis not present

## 2021-06-05 DIAGNOSIS — Z87891 Personal history of nicotine dependence: Secondary | ICD-10-CM | POA: Diagnosis not present

## 2021-06-05 DIAGNOSIS — J449 Chronic obstructive pulmonary disease, unspecified: Secondary | ICD-10-CM | POA: Diagnosis not present

## 2021-06-05 DIAGNOSIS — E7800 Pure hypercholesterolemia, unspecified: Secondary | ICD-10-CM | POA: Diagnosis not present

## 2021-06-05 DIAGNOSIS — I2510 Atherosclerotic heart disease of native coronary artery without angina pectoris: Secondary | ICD-10-CM | POA: Diagnosis not present

## 2021-06-05 DIAGNOSIS — E119 Type 2 diabetes mellitus without complications: Secondary | ICD-10-CM | POA: Diagnosis not present

## 2021-06-05 DIAGNOSIS — H2512 Age-related nuclear cataract, left eye: Secondary | ICD-10-CM | POA: Diagnosis not present

## 2021-06-05 DIAGNOSIS — I10 Essential (primary) hypertension: Secondary | ICD-10-CM | POA: Diagnosis not present

## 2021-07-08 DIAGNOSIS — E1149 Type 2 diabetes mellitus with other diabetic neurological complication: Secondary | ICD-10-CM | POA: Diagnosis not present

## 2021-07-08 DIAGNOSIS — E039 Hypothyroidism, unspecified: Secondary | ICD-10-CM | POA: Diagnosis not present

## 2021-07-08 DIAGNOSIS — I2510 Atherosclerotic heart disease of native coronary artery without angina pectoris: Secondary | ICD-10-CM | POA: Diagnosis not present

## 2021-07-08 DIAGNOSIS — E559 Vitamin D deficiency, unspecified: Secondary | ICD-10-CM | POA: Diagnosis not present

## 2021-07-08 DIAGNOSIS — Z681 Body mass index (BMI) 19 or less, adult: Secondary | ICD-10-CM | POA: Diagnosis not present

## 2021-07-08 DIAGNOSIS — I10 Essential (primary) hypertension: Secondary | ICD-10-CM | POA: Diagnosis not present

## 2021-07-08 DIAGNOSIS — E46 Unspecified protein-calorie malnutrition: Secondary | ICD-10-CM | POA: Diagnosis not present

## 2021-07-08 DIAGNOSIS — E7800 Pure hypercholesterolemia, unspecified: Secondary | ICD-10-CM | POA: Diagnosis not present

## 2021-07-08 DIAGNOSIS — I5022 Chronic systolic (congestive) heart failure: Secondary | ICD-10-CM | POA: Diagnosis not present

## 2021-07-11 DIAGNOSIS — I5022 Chronic systolic (congestive) heart failure: Secondary | ICD-10-CM | POA: Diagnosis not present

## 2021-07-11 DIAGNOSIS — I110 Hypertensive heart disease with heart failure: Secondary | ICD-10-CM | POA: Diagnosis not present

## 2021-07-11 DIAGNOSIS — I2510 Atherosclerotic heart disease of native coronary artery without angina pectoris: Secondary | ICD-10-CM | POA: Diagnosis not present

## 2021-07-11 DIAGNOSIS — I340 Nonrheumatic mitral (valve) insufficiency: Secondary | ICD-10-CM | POA: Diagnosis not present

## 2021-07-11 DIAGNOSIS — I6523 Occlusion and stenosis of bilateral carotid arteries: Secondary | ICD-10-CM | POA: Diagnosis not present

## 2021-07-11 DIAGNOSIS — E785 Hyperlipidemia, unspecified: Secondary | ICD-10-CM | POA: Diagnosis not present

## 2021-09-11 ENCOUNTER — Other Ambulatory Visit: Payer: Self-pay

## 2021-09-11 ENCOUNTER — Encounter: Payer: Self-pay | Admitting: Podiatry

## 2021-09-11 ENCOUNTER — Ambulatory Visit (INDEPENDENT_AMBULATORY_CARE_PROVIDER_SITE_OTHER): Payer: Medicare Other | Admitting: Podiatry

## 2021-09-11 DIAGNOSIS — M79675 Pain in left toe(s): Secondary | ICD-10-CM

## 2021-09-11 DIAGNOSIS — M79674 Pain in right toe(s): Secondary | ICD-10-CM | POA: Diagnosis not present

## 2021-09-11 DIAGNOSIS — B351 Tinea unguium: Secondary | ICD-10-CM

## 2021-09-11 DIAGNOSIS — E1140 Type 2 diabetes mellitus with diabetic neuropathy, unspecified: Secondary | ICD-10-CM

## 2021-09-18 NOTE — Progress Notes (Signed)
?  Subjective:  ?Patient ID: Dorothy Pena, female    DOB: June 20, 1936,  MRN: [id number] ? ?Dorothy Pena presents to clinic today for at risk foot care. Pt has h/o NIDDM with PAD and callus(es) b/l lower extremities and painful thick toenails that are difficult to trim. Painful toenails interfere with ambulation. Aggravating factors include wearing enclosed shoe gear. Pain is relieved with periodic professional debridement. Painful calluses are aggravated when weightbearing with and without shoegear. Pain is relieved with periodic professional debridement. ? ?Patient did not check blood glucose today. ? ?New problem(s): None.  ? ?PCP is Philmore Pali, NP , and last visit was 4 months ago. ? ?No Known Allergies ? ?Review of Systems: Negative except as noted in the HPI. ? ?Objective: No changes noted in today's physical examination. ?Dorothy Pena is a pleasant 86 y.o. female in NAD. AAO X 3. ? ?Vascular Examination: ?CFT <3 seconds b/l LE. Faintly palpable pedal pulses b/l LE. Pedal hair sparse b/l LE. Skin temperature gradient WNL b/l. No pain with calf compression b/l. No edema b/l LE. No cyanosis or clubbing noted b/l LE. ? ?Dermatological Examination: ?Pedal skin thin, shiny and atrophic b/l LE. No open wounds b/l LE. No interdigital macerations noted b/l LE. Toenails 1-5 b/l elongated, discolored, dystrophic, thickened, crumbly with subungual debris and tenderness to dorsal palpation. No hyperkeratoses noted on today's visit. ? ?Musculoskeletal Examination: ?Muscle strength 5/5 to all lower extremity muscle groups bilaterally. HAV with bunion deformity noted b/l LE. Hammertoe deformity noted 2-5 b/l. Utilizes cane for ambulation assistance. ? ?Neurological Examination: ?Pt has subjective symptoms of neuropathy. Protective sensation intact 5/5 intact bilaterally with 10g monofilament b/l. Vibratory sensation intact b/l. ? ?Assessment/Plan: ?1. Pain due to onychomycosis of toenails of both feet   ?2. Type 2 diabetes  mellitus with diabetic neuropathy, without long-term current use of insulin ([place])   ?  ? ?-Patient was evaluated and treated. All patient's and/or POA's questions/concerns answered on today's visit. ?-Consent given for treatment as described below: ?-Toenails 1-5 b/l were debrided in length and girth with sterile nail nippers and dremel without iatrogenic bleeding.  ?-Patient/POA to call should there be question/concern in the interim.  ? ?Return in about 3 months (around 12/12/2021). ? ?Marzetta Board, DPM  ?

## 2021-10-01 DIAGNOSIS — E1149 Type 2 diabetes mellitus with other diabetic neurological complication: Secondary | ICD-10-CM | POA: Diagnosis not present

## 2021-10-01 DIAGNOSIS — E559 Vitamin D deficiency, unspecified: Secondary | ICD-10-CM | POA: Diagnosis not present

## 2021-10-01 DIAGNOSIS — E46 Unspecified protein-calorie malnutrition: Secondary | ICD-10-CM | POA: Diagnosis not present

## 2021-10-01 DIAGNOSIS — I5022 Chronic systolic (congestive) heart failure: Secondary | ICD-10-CM | POA: Diagnosis not present

## 2021-10-01 DIAGNOSIS — I2510 Atherosclerotic heart disease of native coronary artery without angina pectoris: Secondary | ICD-10-CM | POA: Diagnosis not present

## 2021-10-01 DIAGNOSIS — E039 Hypothyroidism, unspecified: Secondary | ICD-10-CM | POA: Diagnosis not present

## 2021-10-01 DIAGNOSIS — E7800 Pure hypercholesterolemia, unspecified: Secondary | ICD-10-CM | POA: Diagnosis not present

## 2021-10-01 DIAGNOSIS — I10 Essential (primary) hypertension: Secondary | ICD-10-CM | POA: Diagnosis not present

## 2021-10-02 ENCOUNTER — Encounter: Payer: Self-pay | Admitting: Physician Assistant

## 2021-10-02 ENCOUNTER — Ambulatory Visit: Payer: Medicare Other | Admitting: Physician Assistant

## 2021-10-02 VITALS — BP 126/60 | HR 78 | Resp 20 | Ht 67.0 in | Wt 108.0 lb

## 2021-10-02 DIAGNOSIS — G309 Alzheimer's disease, unspecified: Secondary | ICD-10-CM

## 2021-10-02 DIAGNOSIS — F0150 Vascular dementia without behavioral disturbance: Secondary | ICD-10-CM | POA: Diagnosis not present

## 2021-10-02 DIAGNOSIS — F0280 Dementia in other diseases classified elsewhere without behavioral disturbance: Secondary | ICD-10-CM | POA: Diagnosis not present

## 2021-10-02 NOTE — Patient Instructions (Addendum)
It was a pleasure to see you today at our office.  ? ?Recommendations: ? ?Follow up in  Oct 13 at 11:30 am  ? Continue Donepezil 10 mg daily.   ?Feel free to visit Facebook page " [email or web address]" for tips of how to care for people with memory problems.  ? ? ?RECOMMENDATIONS FOR ALL PATIENTS WITH MEMORY PROBLEMS: ?1. Continue to exercise (Recommend 30 minutes of walking everyday, or 3 hours every week) ?2. Increase social interactions - continue going to [place] and enjoy social gatherings with friends and family ?3. Eat healthy, avoid fried foods and eat more fruits and vegetables ?4. Maintain adequate blood pressure, blood sugar, and blood cholesterol level. Reducing the risk of stroke and cardiovascular disease also helps promoting better memory. ?5. Avoid stressful situations. Live a simple life and avoid aggravations. Organize your time and prepare for the next day in anticipation. ?6. Sleep well, avoid any interruptions of sleep and avoid any distractions in the bedroom that may interfere with adequate sleep quality ?7. Avoid sugar, avoid sweets as there is a strong link between excessive sugar intake, diabetes, and cognitive impairment ?We discussed the Mediterranean diet, which has been shown to help patients reduce the risk of progressive memory disorders and reduces cardiovascular risk. This includes eating fish, eat fruits and green leafy vegetables, nuts like almonds and hazelnuts, walnuts, and also use olive oil. Avoid fast foods and fried foods as much as possible. Avoid sweets and sugar as sugar use has been linked to worsening of memory function. ? ?There is always a concern of gradual progression of memory problems. If this is the case, then we may need to adjust level of care according to patient needs. Support, both to the patient and caregiver, should then be put into place.  ? ? ?FALL PRECAUTIONS: Be cautious when walking. Scan the area for obstacles that may increase the risk of trips and falls. When  getting up in the mornings, sit up at the edge of the bed for a few minutes before getting out of bed. Consider elevating the bed at the head end to avoid drop of blood pressure when getting up. Walk always in a well-lit room (use night lights in the walls). Avoid area rugs or power cords from appliances in the middle of the walkways. Use a walker or a cane if necessary and consider physical therapy for balance exercise. Get your eyesight checked regularly. ? ?FINANCIAL OVERSIGHT: Supervision, especially oversight when making financial decisions or transactions is also recommended. ? ?HOME SAFETY: Consider the safety of the kitchen when operating appliances like stoves, microwave oven, and blender. Consider having supervision and share cooking responsibilities until no longer able to participate in those. Accidents with firearms and other hazards in the house should be identified and addressed as well. ? ? ?ABILITY TO BE LEFT ALONE: If patient is unable to contact 911 operator, consider using LifeLine, or when the need is there, arrange for someone to stay with patients. Smoking is a fire hazard, consider supervision or cessation. Risk of wandering should be assessed by caregiver and if detected at any point, supervision and safe proof recommendations should be instituted. ? ?MEDICATION SUPERVISION: Inability to self-administer medication needs to be constantly addressed. Implement a mechanism to ensure safe administration of the medications. ? ? ?DRIVING: Regarding driving, in patients with progressive memory problems, driving will be impaired. We advise to have someone else do the driving if trouble finding directions or if minor accidents are reported. Independent driving assessment  is available to determine safety of driving. ? ? ?If you are interested in the driving assessment, you can contact the following: ? ?[facility] [employer] in [place] ? ?[place]  [phone] ? ?[facility] [phone] ? ?[facility] [phone] or [phone] ?  ?

## 2021-10-02 NOTE — Progress Notes (Signed)
? ?Assessment/Plan:  ? 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 ? Today, unable to perform MMSE due to memory decline, with delayed recall 0/3. MRI of the brain in October 2022 shows severe chronic vascular disease, as well as severe cerebral and cerebellar atrophy.  Patient is on donepezil 10 mg daily, tolerating well.  Unfortunately, disease is advanced, and there is no indication to increase dose or add any other underlying dementia medication, as the risks outweighed the benefits. ? ?Recommendations  ?Discussed safety both in and out of the home. She may need 24/7 monitoring for safety versus ALF ?Discussed the importance of regular daily schedule  to maintain brain function.  ?Continue to monitor mood by PCP ?Stay active at least 30 minutes at least 3 times a week.  ?Naps should be scheduled and should be no longer than 60 minutes and should not occur after 2 PM.  ?Mediterranean diet is recommended  ?Continue donepezil 10 mg daily. Side effects discussed  ?Follow up in 6 months. ? ? ?Case discussed with Dr. Tomi Likens who agrees with the plan ? ? ?Subjective:  ? ? ?Dorothy Pena is a 86 y.o. female with a history of hypertension, vitamin D deficiency, hypothyroidism, chronic systolic heart failure, normocytic anemia, diabetes type 2, hyperlipidemia, seen today in follow up for memory loss.  She had been seen on 03/12/2021, at which time she had been treated for UTI, with associated mental status changes.  MMSE at that time was 19/30, with findings suspicious for dementia.  Of note, the TSH at the time was above 13 as well but is now better controlled.  This patient is accompanied in the office by her son who supplements the history.  Previous records as well as any outside records available were reviewed prior to todays visit.    ?In today's visit, the patient's memory is reported to be about the same, continuing to repeat herself. Confusion has resolved. Her mood is stable.   She has occasional hallucinations, such as seeing her granddaughter as a baby instead of at her real age of 29.  Sometimes, she asked about her dad, not knowing that he is dead.  No wandering behavior.  She continues to enjoy going shopping to the [employer] and [employer], buys stuff but then " complains that there is too much stuff in the room".  She continues to refuse to shower, prefers not to change her clothes.  No paranoia. Her appetite is reduced, taking Megace by PCP,and her son reports that she does not drink enough water. Denies trouble swallowing.  She does not cook.  Her sleep is better, with less nightwalking than before.  Denies any vivid dreams.  She ambulates without difficulty, but is using a walker for safety.  No falls or head injuries.  She denies any headaches, double vision, dizziness, focal numbness or tingling, unilateral weakness or tremors.  She continues to have urine incontinence wearing diapers.  ? ? ?MRI of the brain 04/07/2021 no acute intracranial abnormality.2. 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 Advanced cerebral and cerebellar atrophy, progressed from 2019. ? ? ?Labs 03/12/21  TSH 7.64 ?UA neg  ? ?PREVIOUS MEDICATIONS:  ? ?CURRENT MEDICATIONS:  ?Outpatient Encounter Medications as of 10/02/2021  ?Medication Sig  ? carvedilol (COREG) 25 MG tablet Take 25 mg by mouth 2 (two) times daily with a meal.  ? cetirizine (ZYRTEC) 10 MG tablet Take 20 mg by mouth daily.  ?  clopidogrel (PLAVIX) 75 MG tablet Take 1 tablet by mouth daily.  ? digoxin (LANOXIN) 0.125 MG tablet Take 0.125 mg by mouth daily.  ? donepezil (ARICEPT) 10 MG tablet Take half tablet (5 mg) daily for 2 weeks, then increase to the full tablet at 10 mg daily  ? furosemide (LASIX) 20 MG tablet Take by mouth.  ? glimepiride (AMARYL) 4 MG tablet Take 4 mg by mouth 2 (two) times daily.  ? ketorolac (ACULAR) 0.5 % ophthalmic solution Place 1 drop into the left eye 4  (four) times daily.  ? levothyroxine (SYNTHROID) 112 MCG tablet Take 112 mcg by mouth daily.  ? levothyroxine (SYNTHROID) 112 MCG tablet Take 1 tablet by mouth daily.  ? lisinopril (ZESTRIL) 10 MG tablet Take 5 mg by mouth daily.  ? lisinopril (ZESTRIL) 10 MG tablet Take 0.5 tablets by mouth daily.  ? megestrol (MEGACE) 40 MG/ML suspension Take 200 mg by mouth daily.  ? metFORMIN (GLUCOPHAGE) 1000 MG tablet Take 1,000 mg by mouth 2 (two) times daily with a meal.  ? mupirocin ointment (BACTROBAN) 2 % Apply 1 application topically daily.  ? nitroGLYCERIN (NITROSTAT) 0.4 MG SL tablet Place under the tongue.  ? ofloxacin (OCUFLOX) 0.3 % ophthalmic solution Place 1 drop into the left eye 4 (four) times daily.  ? prednisoLONE acetate (PRED FORTE) 1 % ophthalmic suspension Place 1 drop into the left eye 4 (four) times daily.  ? ranolazine (RANEXA) 500 MG 12 hr tablet Take 1 tablet by mouth 2 (two) times daily.  ? rosuvastatin (CRESTOR) 20 MG tablet Take 1 tablet (20 mg total) by mouth daily.  ? traMADol (ULTRAM) 50 MG tablet Take 50 mg by mouth every 4 (four) hours as needed.  ? triamcinolone cream (KENALOG) 0.5 % Apply topically 2 (two) times daily.  ? Vitamin D, Ergocalciferol, (DRISDOL) 50000 UNITS CAPS capsule Take 50,000 Units by mouth every 7 (seven) days. On Sunday  ? ?No facility-administered encounter medications on file as of 10/02/2021.  ? ? ? ?Objective:  ?  ? ?PHYSICAL EXAMINATION:   ? ?VITALS:   ?Vitals:  ? 10/02/21 1107  ?Resp: 20  ?Height: '5\' 8"'$  (1.727 m)  ? ? ? ?GEN:  The patient appears stated age and is in NAD. ?HEENT:  Normocephalic, atraumatic.  ? ?Neurological examination: ? ?General: NAD, well-groomed, appears stated age. ?Orientation: The patient is alert. Oriented to person, not to place or date, it is 2020, delayed recall 0/3  ?Cranial nerves: There is good facial symmetry.The speech is fluent and clear. No aphasia or dysarthria. Fund of knowledge is reduced. Recent and remote memory are impaired.  Attention and concentration are reduced.  Able to name objects and repeat phrases.  Hearing is intact to conversational tone.    ?Sensation: Sensation is intact to light touch throughout ?Motor: Strength is at least antigravity x4. ?Tremors: none  ?DTR's 2/4 in UE/LE  ? ?  ?   ? View : No data to display.  ?  ?  ?  ? ? ?  03/12/2021  ?  3:00 PM  ?MMSE - Mini Mental State Exam  ?Orientation to time 2  ?Orientation to Place 3  ?Registration 3  ?Attention/ Calculation 3  ?Recall 0  ?Language- name 2 objects 2  ?Language- repeat 1  ?Language- follow 3 step command 3  ?Language- read & follow direction 1  ?Write a sentence 1  ?Copy design 0  ?Total score 19  ?  ?   ? View : No  data to display.  ?  ?  ?  ?  ? ?  ?Movement examination: ?Tone: There is normal tone in the UE/LE ?Abnormal movements:  no tremor.  No myoclonus.  No asterixis.   ?Coordination:  There is no decremation with RAM's. Normal finger to nose  ?Gait and Station: The patient has no difficulty arising out of a deep-seated chair without the use of the hands. The patient's stride length is good.  Gait is cautious and narrow.  ? ? ? Total time spent on today's visit was 30  minutes, including both face-to-face time and nonface-to-face time. Time included that spent on review of records (prior notes available to me/labs/imaging if pertinent), discussing treatment and goals, answering patient's questions and coordinating care. ? ?Cc:  Philmore Pali, NP ?Sharene Butters, PA-C   ?

## 2021-11-27 DIAGNOSIS — R7989 Other specified abnormal findings of blood chemistry: Secondary | ICD-10-CM | POA: Diagnosis not present

## 2021-12-18 ENCOUNTER — Ambulatory Visit: Payer: Medicare Other | Admitting: Podiatry

## 2021-12-18 ENCOUNTER — Encounter: Payer: Self-pay | Admitting: Podiatry

## 2021-12-18 DIAGNOSIS — E1140 Type 2 diabetes mellitus with diabetic neuropathy, unspecified: Secondary | ICD-10-CM | POA: Diagnosis not present

## 2021-12-18 DIAGNOSIS — B351 Tinea unguium: Secondary | ICD-10-CM | POA: Diagnosis not present

## 2021-12-18 DIAGNOSIS — M79675 Pain in left toe(s): Secondary | ICD-10-CM | POA: Diagnosis not present

## 2021-12-18 DIAGNOSIS — M79674 Pain in right toe(s): Secondary | ICD-10-CM

## 2021-12-28 NOTE — Progress Notes (Signed)
  Subjective:  Patient ID: Dorothy Pena, female    DOB: [birth year],  MRN: [id number]  Dorothy Pena presents to clinic today for at risk foot care with history of diabetic neuropathy and painful elongated mycotic toenails 1-5 bilaterally which are tender when wearing enclosed Pena gear. Pain is relieved with periodic professional debridement.  Last known HgA1c was unknown.  Patient did not check blood glucose today.  New problem(s): None.   PCP is Philmore Pali, NP.  No Known Allergies  Review of Systems: Negative except as noted in the HPI.  Objective:  Dorothy Pena is a pleasant 86 y.o. female in NAD. AAO X 3.  Vascular Examination: CFT <3 seconds b/l LE. Faintly palpable pedal pulses b/l LE. Pedal hair sparse b/l LE. Skin temperature gradient WNL b/l. No pain with calf compression b/l. No edema b/l LE. No cyanosis or clubbing noted b/l LE.  Dermatological Examination: Pedal skin thin, shiny and atrophic b/l LE. Healing scab noted medial aspect right 2nd toe. No erythema, no edema, no drainage. No open wounds b/l LE. No interdigital macerations noted b/l LE. Toenails 1-5 b/l elongated, discolored, dystrophic, thickened, crumbly with subungual debris and tenderness to dorsal palpation. No hyperkeratoses noted on today's visit.  Musculoskeletal Examination: Muscle strength 5/5 to all lower extremity muscle groups bilaterally. HAV with bunion deformity noted b/l LE. Hammertoe deformity noted 2-5 b/l. Utilizes cane for ambulation assistance.  Neurological Examination: Pt has subjective symptoms of neuropathy. Protective sensation intact 5/5 intact bilaterally with 10g monofilament b/l. Vibratory sensation intact b/l.  Assessment/Plan: 1. Pain due to onychomycosis of toenails of both feet   2. Type 2 diabetes mellitus with diabetic neuropathy, without long-term current use of insulin ([place])     -Examined patient. -Continue foot and Pena inspections daily. Monitor blood glucose per  PCP/Endocrinologist's recommendations. -Patient to continue soft, supportive Pena gear daily. -Mycotic toenails 1-5 bilaterally were debrided in length and girth with sterile nail nippers and dremel without incident. -Patient/POA to call should there be question/concern in the interim.   Return in about 3 months (around 03/20/2022).  Marzetta Board, DPM

## 2021-12-31 DIAGNOSIS — E46 Unspecified protein-calorie malnutrition: Secondary | ICD-10-CM | POA: Diagnosis not present

## 2021-12-31 DIAGNOSIS — I10 Essential (primary) hypertension: Secondary | ICD-10-CM | POA: Diagnosis not present

## 2021-12-31 DIAGNOSIS — E559 Vitamin D deficiency, unspecified: Secondary | ICD-10-CM | POA: Diagnosis not present

## 2021-12-31 DIAGNOSIS — E039 Hypothyroidism, unspecified: Secondary | ICD-10-CM | POA: Diagnosis not present

## 2021-12-31 DIAGNOSIS — I5022 Chronic systolic (congestive) heart failure: Secondary | ICD-10-CM | POA: Diagnosis not present

## 2021-12-31 DIAGNOSIS — Z681 Body mass index (BMI) 19 or less, adult: Secondary | ICD-10-CM | POA: Diagnosis not present

## 2021-12-31 DIAGNOSIS — E1149 Type 2 diabetes mellitus with other diabetic neurological complication: Secondary | ICD-10-CM | POA: Diagnosis not present

## 2021-12-31 DIAGNOSIS — I2510 Atherosclerotic heart disease of native coronary artery without angina pectoris: Secondary | ICD-10-CM | POA: Diagnosis not present

## 2021-12-31 DIAGNOSIS — E7800 Pure hypercholesterolemia, unspecified: Secondary | ICD-10-CM | POA: Diagnosis not present

## 2022-01-30 DIAGNOSIS — M1990 Unspecified osteoarthritis, unspecified site: Secondary | ICD-10-CM | POA: Diagnosis not present

## 2022-01-30 DIAGNOSIS — Z79899 Other long term (current) drug therapy: Secondary | ICD-10-CM | POA: Diagnosis not present

## 2022-01-30 DIAGNOSIS — E039 Hypothyroidism, unspecified: Secondary | ICD-10-CM | POA: Diagnosis not present

## 2022-01-30 DIAGNOSIS — E7800 Pure hypercholesterolemia, unspecified: Secondary | ICD-10-CM | POA: Diagnosis not present

## 2022-01-30 DIAGNOSIS — E119 Type 2 diabetes mellitus without complications: Secondary | ICD-10-CM | POA: Diagnosis not present

## 2022-01-30 DIAGNOSIS — R231 Pallor: Secondary | ICD-10-CM | POA: Diagnosis not present

## 2022-01-30 DIAGNOSIS — I2510 Atherosclerotic heart disease of native coronary artery without angina pectoris: Secondary | ICD-10-CM | POA: Diagnosis not present

## 2022-01-30 DIAGNOSIS — Z7902 Long term (current) use of antithrombotics/antiplatelets: Secondary | ICD-10-CM | POA: Diagnosis not present

## 2022-01-30 DIAGNOSIS — I959 Hypotension, unspecified: Secondary | ICD-10-CM | POA: Diagnosis not present

## 2022-01-30 DIAGNOSIS — E860 Dehydration: Secondary | ICD-10-CM | POA: Diagnosis not present

## 2022-01-30 DIAGNOSIS — Z87440 Personal history of urinary (tract) infections: Secondary | ICD-10-CM | POA: Diagnosis not present

## 2022-01-30 DIAGNOSIS — K219 Gastro-esophageal reflux disease without esophagitis: Secondary | ICD-10-CM | POA: Diagnosis not present

## 2022-01-30 DIAGNOSIS — I252 Old myocardial infarction: Secondary | ICD-10-CM | POA: Diagnosis not present

## 2022-01-30 DIAGNOSIS — I340 Nonrheumatic mitral (valve) insufficiency: Secondary | ICD-10-CM | POA: Diagnosis not present

## 2022-01-30 DIAGNOSIS — I5023 Acute on chronic systolic (congestive) heart failure: Secondary | ICD-10-CM | POA: Diagnosis not present

## 2022-01-30 DIAGNOSIS — I351 Nonrheumatic aortic (valve) insufficiency: Secondary | ICD-10-CM | POA: Diagnosis not present

## 2022-01-30 DIAGNOSIS — I472 Ventricular tachycardia, unspecified: Secondary | ICD-10-CM | POA: Diagnosis not present

## 2022-01-30 DIAGNOSIS — Z87891 Personal history of nicotine dependence: Secondary | ICD-10-CM | POA: Diagnosis not present

## 2022-01-30 DIAGNOSIS — R001 Bradycardia, unspecified: Secondary | ICD-10-CM | POA: Diagnosis not present

## 2022-01-30 DIAGNOSIS — I110 Hypertensive heart disease with heart failure: Secondary | ICD-10-CM | POA: Diagnosis not present

## 2022-01-30 DIAGNOSIS — R55 Syncope and collapse: Secondary | ICD-10-CM | POA: Diagnosis not present

## 2022-01-30 DIAGNOSIS — I429 Cardiomyopathy, unspecified: Secondary | ICD-10-CM | POA: Diagnosis not present

## 2022-01-30 DIAGNOSIS — Z7984 Long term (current) use of oral hypoglycemic drugs: Secondary | ICD-10-CM | POA: Diagnosis not present

## 2022-01-30 DIAGNOSIS — J449 Chronic obstructive pulmonary disease, unspecified: Secondary | ICD-10-CM | POA: Diagnosis not present

## 2022-01-31 ENCOUNTER — Encounter: Payer: Self-pay | Admitting: Cardiology

## 2022-01-31 DIAGNOSIS — I340 Nonrheumatic mitral (valve) insufficiency: Secondary | ICD-10-CM

## 2022-01-31 DIAGNOSIS — I351 Nonrheumatic aortic (valve) insufficiency: Secondary | ICD-10-CM

## 2022-02-05 DIAGNOSIS — I429 Cardiomyopathy, unspecified: Secondary | ICD-10-CM | POA: Diagnosis not present

## 2022-02-05 DIAGNOSIS — Z7984 Long term (current) use of oral hypoglycemic drugs: Secondary | ICD-10-CM | POA: Diagnosis not present

## 2022-02-05 DIAGNOSIS — Z9181 History of falling: Secondary | ICD-10-CM | POA: Diagnosis not present

## 2022-02-05 DIAGNOSIS — M1990 Unspecified osteoarthritis, unspecified site: Secondary | ICD-10-CM | POA: Diagnosis not present

## 2022-02-05 DIAGNOSIS — I2510 Atherosclerotic heart disease of native coronary artery without angina pectoris: Secondary | ICD-10-CM | POA: Diagnosis not present

## 2022-02-05 DIAGNOSIS — I472 Ventricular tachycardia, unspecified: Secondary | ICD-10-CM | POA: Diagnosis not present

## 2022-02-05 DIAGNOSIS — E119 Type 2 diabetes mellitus without complications: Secondary | ICD-10-CM | POA: Diagnosis not present

## 2022-02-05 DIAGNOSIS — Z87891 Personal history of nicotine dependence: Secondary | ICD-10-CM | POA: Diagnosis not present

## 2022-02-05 DIAGNOSIS — I5021 Acute systolic (congestive) heart failure: Secondary | ICD-10-CM | POA: Diagnosis not present

## 2022-02-05 DIAGNOSIS — I252 Old myocardial infarction: Secondary | ICD-10-CM | POA: Diagnosis not present

## 2022-02-05 DIAGNOSIS — I110 Hypertensive heart disease with heart failure: Secondary | ICD-10-CM | POA: Diagnosis not present

## 2022-02-05 DIAGNOSIS — R55 Syncope and collapse: Secondary | ICD-10-CM | POA: Diagnosis not present

## 2022-02-05 DIAGNOSIS — E039 Hypothyroidism, unspecified: Secondary | ICD-10-CM | POA: Diagnosis not present

## 2022-02-05 DIAGNOSIS — Z87440 Personal history of urinary (tract) infections: Secondary | ICD-10-CM | POA: Diagnosis not present

## 2022-02-05 DIAGNOSIS — E7800 Pure hypercholesterolemia, unspecified: Secondary | ICD-10-CM | POA: Diagnosis not present

## 2022-02-05 DIAGNOSIS — J449 Chronic obstructive pulmonary disease, unspecified: Secondary | ICD-10-CM | POA: Diagnosis not present

## 2022-02-05 DIAGNOSIS — E860 Dehydration: Secondary | ICD-10-CM | POA: Diagnosis not present

## 2022-02-05 DIAGNOSIS — I083 Combined rheumatic disorders of mitral, aortic and tricuspid valves: Secondary | ICD-10-CM | POA: Diagnosis not present

## 2022-02-05 DIAGNOSIS — Z7902 Long term (current) use of antithrombotics/antiplatelets: Secondary | ICD-10-CM | POA: Diagnosis not present

## 2022-02-05 DIAGNOSIS — K219 Gastro-esophageal reflux disease without esophagitis: Secondary | ICD-10-CM | POA: Diagnosis not present

## 2022-02-09 DIAGNOSIS — Z87891 Personal history of nicotine dependence: Secondary | ICD-10-CM | POA: Diagnosis not present

## 2022-02-09 DIAGNOSIS — R55 Syncope and collapse: Secondary | ICD-10-CM | POA: Diagnosis not present

## 2022-02-09 DIAGNOSIS — K219 Gastro-esophageal reflux disease without esophagitis: Secondary | ICD-10-CM | POA: Diagnosis not present

## 2022-02-09 DIAGNOSIS — Z7984 Long term (current) use of oral hypoglycemic drugs: Secondary | ICD-10-CM | POA: Diagnosis not present

## 2022-02-09 DIAGNOSIS — J449 Chronic obstructive pulmonary disease, unspecified: Secondary | ICD-10-CM | POA: Diagnosis not present

## 2022-02-09 DIAGNOSIS — Z7902 Long term (current) use of antithrombotics/antiplatelets: Secondary | ICD-10-CM | POA: Diagnosis not present

## 2022-02-09 DIAGNOSIS — I252 Old myocardial infarction: Secondary | ICD-10-CM | POA: Diagnosis not present

## 2022-02-09 DIAGNOSIS — M1990 Unspecified osteoarthritis, unspecified site: Secondary | ICD-10-CM | POA: Diagnosis not present

## 2022-02-09 DIAGNOSIS — I472 Ventricular tachycardia, unspecified: Secondary | ICD-10-CM | POA: Diagnosis not present

## 2022-02-09 DIAGNOSIS — I083 Combined rheumatic disorders of mitral, aortic and tricuspid valves: Secondary | ICD-10-CM | POA: Diagnosis not present

## 2022-02-09 DIAGNOSIS — E039 Hypothyroidism, unspecified: Secondary | ICD-10-CM | POA: Diagnosis not present

## 2022-02-09 DIAGNOSIS — E7800 Pure hypercholesterolemia, unspecified: Secondary | ICD-10-CM | POA: Diagnosis not present

## 2022-02-09 DIAGNOSIS — I2510 Atherosclerotic heart disease of native coronary artery without angina pectoris: Secondary | ICD-10-CM | POA: Diagnosis not present

## 2022-02-09 DIAGNOSIS — I429 Cardiomyopathy, unspecified: Secondary | ICD-10-CM | POA: Diagnosis not present

## 2022-02-09 DIAGNOSIS — Z87440 Personal history of urinary (tract) infections: Secondary | ICD-10-CM | POA: Diagnosis not present

## 2022-02-09 DIAGNOSIS — Z9181 History of falling: Secondary | ICD-10-CM | POA: Diagnosis not present

## 2022-02-09 DIAGNOSIS — I5021 Acute systolic (congestive) heart failure: Secondary | ICD-10-CM | POA: Diagnosis not present

## 2022-02-09 DIAGNOSIS — I110 Hypertensive heart disease with heart failure: Secondary | ICD-10-CM | POA: Diagnosis not present

## 2022-02-09 DIAGNOSIS — E119 Type 2 diabetes mellitus without complications: Secondary | ICD-10-CM | POA: Diagnosis not present

## 2022-02-09 DIAGNOSIS — E860 Dehydration: Secondary | ICD-10-CM | POA: Diagnosis not present

## 2022-02-11 DIAGNOSIS — I208 Other forms of angina pectoris: Secondary | ICD-10-CM | POA: Diagnosis not present

## 2022-02-11 DIAGNOSIS — Z139 Encounter for screening, unspecified: Secondary | ICD-10-CM | POA: Diagnosis not present

## 2022-02-11 DIAGNOSIS — Z7902 Long term (current) use of antithrombotics/antiplatelets: Secondary | ICD-10-CM | POA: Diagnosis not present

## 2022-02-11 DIAGNOSIS — Z87440 Personal history of urinary (tract) infections: Secondary | ICD-10-CM | POA: Diagnosis not present

## 2022-02-11 DIAGNOSIS — I110 Hypertensive heart disease with heart failure: Secondary | ICD-10-CM | POA: Diagnosis not present

## 2022-02-11 DIAGNOSIS — I472 Ventricular tachycardia, unspecified: Secondary | ICD-10-CM | POA: Diagnosis not present

## 2022-02-11 DIAGNOSIS — E860 Dehydration: Secondary | ICD-10-CM | POA: Diagnosis not present

## 2022-02-11 DIAGNOSIS — Z9181 History of falling: Secondary | ICD-10-CM | POA: Diagnosis not present

## 2022-02-11 DIAGNOSIS — I5022 Chronic systolic (congestive) heart failure: Secondary | ICD-10-CM | POA: Diagnosis not present

## 2022-02-11 DIAGNOSIS — E7800 Pure hypercholesterolemia, unspecified: Secondary | ICD-10-CM | POA: Diagnosis not present

## 2022-02-11 DIAGNOSIS — I5021 Acute systolic (congestive) heart failure: Secondary | ICD-10-CM | POA: Diagnosis not present

## 2022-02-11 DIAGNOSIS — Z7984 Long term (current) use of oral hypoglycemic drugs: Secondary | ICD-10-CM | POA: Diagnosis not present

## 2022-02-11 DIAGNOSIS — I10 Essential (primary) hypertension: Secondary | ICD-10-CM | POA: Diagnosis not present

## 2022-02-11 DIAGNOSIS — I083 Combined rheumatic disorders of mitral, aortic and tricuspid valves: Secondary | ICD-10-CM | POA: Diagnosis not present

## 2022-02-11 DIAGNOSIS — E039 Hypothyroidism, unspecified: Secondary | ICD-10-CM | POA: Diagnosis not present

## 2022-02-11 DIAGNOSIS — I2510 Atherosclerotic heart disease of native coronary artery without angina pectoris: Secondary | ICD-10-CM | POA: Diagnosis not present

## 2022-02-11 DIAGNOSIS — I429 Cardiomyopathy, unspecified: Secondary | ICD-10-CM | POA: Diagnosis not present

## 2022-02-11 DIAGNOSIS — R55 Syncope and collapse: Secondary | ICD-10-CM | POA: Diagnosis not present

## 2022-02-11 DIAGNOSIS — M1990 Unspecified osteoarthritis, unspecified site: Secondary | ICD-10-CM | POA: Diagnosis not present

## 2022-02-11 DIAGNOSIS — Z7689 Persons encountering health services in other specified circumstances: Secondary | ICD-10-CM | POA: Diagnosis not present

## 2022-02-11 DIAGNOSIS — J449 Chronic obstructive pulmonary disease, unspecified: Secondary | ICD-10-CM | POA: Diagnosis not present

## 2022-02-11 DIAGNOSIS — K219 Gastro-esophageal reflux disease without esophagitis: Secondary | ICD-10-CM | POA: Diagnosis not present

## 2022-02-11 DIAGNOSIS — I252 Old myocardial infarction: Secondary | ICD-10-CM | POA: Diagnosis not present

## 2022-02-11 DIAGNOSIS — Z79899 Other long term (current) drug therapy: Secondary | ICD-10-CM | POA: Diagnosis not present

## 2022-02-11 DIAGNOSIS — Z87891 Personal history of nicotine dependence: Secondary | ICD-10-CM | POA: Diagnosis not present

## 2022-02-11 DIAGNOSIS — E119 Type 2 diabetes mellitus without complications: Secondary | ICD-10-CM | POA: Diagnosis not present

## 2022-02-12 DIAGNOSIS — Z7984 Long term (current) use of oral hypoglycemic drugs: Secondary | ICD-10-CM | POA: Diagnosis not present

## 2022-02-12 DIAGNOSIS — I472 Ventricular tachycardia, unspecified: Secondary | ICD-10-CM | POA: Diagnosis not present

## 2022-02-12 DIAGNOSIS — I5021 Acute systolic (congestive) heart failure: Secondary | ICD-10-CM | POA: Diagnosis not present

## 2022-02-12 DIAGNOSIS — K219 Gastro-esophageal reflux disease without esophagitis: Secondary | ICD-10-CM | POA: Diagnosis not present

## 2022-02-12 DIAGNOSIS — Z87440 Personal history of urinary (tract) infections: Secondary | ICD-10-CM | POA: Diagnosis not present

## 2022-02-12 DIAGNOSIS — I252 Old myocardial infarction: Secondary | ICD-10-CM | POA: Diagnosis not present

## 2022-02-12 DIAGNOSIS — I110 Hypertensive heart disease with heart failure: Secondary | ICD-10-CM | POA: Diagnosis not present

## 2022-02-12 DIAGNOSIS — R55 Syncope and collapse: Secondary | ICD-10-CM | POA: Diagnosis not present

## 2022-02-12 DIAGNOSIS — Z7902 Long term (current) use of antithrombotics/antiplatelets: Secondary | ICD-10-CM | POA: Diagnosis not present

## 2022-02-12 DIAGNOSIS — M1990 Unspecified osteoarthritis, unspecified site: Secondary | ICD-10-CM | POA: Diagnosis not present

## 2022-02-12 DIAGNOSIS — E119 Type 2 diabetes mellitus without complications: Secondary | ICD-10-CM | POA: Diagnosis not present

## 2022-02-12 DIAGNOSIS — I429 Cardiomyopathy, unspecified: Secondary | ICD-10-CM | POA: Diagnosis not present

## 2022-02-12 DIAGNOSIS — E039 Hypothyroidism, unspecified: Secondary | ICD-10-CM | POA: Diagnosis not present

## 2022-02-12 DIAGNOSIS — E7800 Pure hypercholesterolemia, unspecified: Secondary | ICD-10-CM | POA: Diagnosis not present

## 2022-02-12 DIAGNOSIS — E860 Dehydration: Secondary | ICD-10-CM | POA: Diagnosis not present

## 2022-02-12 DIAGNOSIS — J449 Chronic obstructive pulmonary disease, unspecified: Secondary | ICD-10-CM | POA: Diagnosis not present

## 2022-02-12 DIAGNOSIS — Z0000 Encounter for general adult medical examination without abnormal findings: Secondary | ICD-10-CM | POA: Diagnosis not present

## 2022-02-12 DIAGNOSIS — I2510 Atherosclerotic heart disease of native coronary artery without angina pectoris: Secondary | ICD-10-CM | POA: Diagnosis not present

## 2022-02-12 DIAGNOSIS — Z87891 Personal history of nicotine dependence: Secondary | ICD-10-CM | POA: Diagnosis not present

## 2022-02-12 DIAGNOSIS — Z9181 History of falling: Secondary | ICD-10-CM | POA: Diagnosis not present

## 2022-02-12 DIAGNOSIS — I083 Combined rheumatic disorders of mitral, aortic and tricuspid valves: Secondary | ICD-10-CM | POA: Diagnosis not present

## 2022-02-16 DIAGNOSIS — Z87440 Personal history of urinary (tract) infections: Secondary | ICD-10-CM | POA: Diagnosis not present

## 2022-02-16 DIAGNOSIS — R55 Syncope and collapse: Secondary | ICD-10-CM | POA: Diagnosis not present

## 2022-02-16 DIAGNOSIS — E119 Type 2 diabetes mellitus without complications: Secondary | ICD-10-CM | POA: Diagnosis not present

## 2022-02-16 DIAGNOSIS — Z87891 Personal history of nicotine dependence: Secondary | ICD-10-CM | POA: Diagnosis not present

## 2022-02-16 DIAGNOSIS — I5021 Acute systolic (congestive) heart failure: Secondary | ICD-10-CM | POA: Diagnosis not present

## 2022-02-16 DIAGNOSIS — I472 Ventricular tachycardia, unspecified: Secondary | ICD-10-CM | POA: Diagnosis not present

## 2022-02-16 DIAGNOSIS — I2510 Atherosclerotic heart disease of native coronary artery without angina pectoris: Secondary | ICD-10-CM | POA: Diagnosis not present

## 2022-02-16 DIAGNOSIS — I110 Hypertensive heart disease with heart failure: Secondary | ICD-10-CM | POA: Diagnosis not present

## 2022-02-16 DIAGNOSIS — E7800 Pure hypercholesterolemia, unspecified: Secondary | ICD-10-CM | POA: Diagnosis not present

## 2022-02-16 DIAGNOSIS — E039 Hypothyroidism, unspecified: Secondary | ICD-10-CM | POA: Diagnosis not present

## 2022-02-16 DIAGNOSIS — I083 Combined rheumatic disorders of mitral, aortic and tricuspid valves: Secondary | ICD-10-CM | POA: Diagnosis not present

## 2022-02-16 DIAGNOSIS — Z7902 Long term (current) use of antithrombotics/antiplatelets: Secondary | ICD-10-CM | POA: Diagnosis not present

## 2022-02-16 DIAGNOSIS — J449 Chronic obstructive pulmonary disease, unspecified: Secondary | ICD-10-CM | POA: Diagnosis not present

## 2022-02-16 DIAGNOSIS — E860 Dehydration: Secondary | ICD-10-CM | POA: Diagnosis not present

## 2022-02-16 DIAGNOSIS — M1990 Unspecified osteoarthritis, unspecified site: Secondary | ICD-10-CM | POA: Diagnosis not present

## 2022-02-16 DIAGNOSIS — I429 Cardiomyopathy, unspecified: Secondary | ICD-10-CM | POA: Diagnosis not present

## 2022-02-16 DIAGNOSIS — Z7984 Long term (current) use of oral hypoglycemic drugs: Secondary | ICD-10-CM | POA: Diagnosis not present

## 2022-02-16 DIAGNOSIS — I252 Old myocardial infarction: Secondary | ICD-10-CM | POA: Diagnosis not present

## 2022-02-16 DIAGNOSIS — K219 Gastro-esophageal reflux disease without esophagitis: Secondary | ICD-10-CM | POA: Diagnosis not present

## 2022-02-16 DIAGNOSIS — Z9181 History of falling: Secondary | ICD-10-CM | POA: Diagnosis not present

## 2022-02-18 DIAGNOSIS — Z87440 Personal history of urinary (tract) infections: Secondary | ICD-10-CM | POA: Diagnosis not present

## 2022-02-18 DIAGNOSIS — E039 Hypothyroidism, unspecified: Secondary | ICD-10-CM | POA: Diagnosis not present

## 2022-02-18 DIAGNOSIS — I2510 Atherosclerotic heart disease of native coronary artery without angina pectoris: Secondary | ICD-10-CM | POA: Diagnosis not present

## 2022-02-18 DIAGNOSIS — Z7902 Long term (current) use of antithrombotics/antiplatelets: Secondary | ICD-10-CM | POA: Diagnosis not present

## 2022-02-18 DIAGNOSIS — E860 Dehydration: Secondary | ICD-10-CM | POA: Diagnosis not present

## 2022-02-18 DIAGNOSIS — Z7984 Long term (current) use of oral hypoglycemic drugs: Secondary | ICD-10-CM | POA: Diagnosis not present

## 2022-02-18 DIAGNOSIS — E7800 Pure hypercholesterolemia, unspecified: Secondary | ICD-10-CM | POA: Diagnosis not present

## 2022-02-18 DIAGNOSIS — M1990 Unspecified osteoarthritis, unspecified site: Secondary | ICD-10-CM | POA: Diagnosis not present

## 2022-02-18 DIAGNOSIS — Z9181 History of falling: Secondary | ICD-10-CM | POA: Diagnosis not present

## 2022-02-18 DIAGNOSIS — K219 Gastro-esophageal reflux disease without esophagitis: Secondary | ICD-10-CM | POA: Diagnosis not present

## 2022-02-18 DIAGNOSIS — J449 Chronic obstructive pulmonary disease, unspecified: Secondary | ICD-10-CM | POA: Diagnosis not present

## 2022-02-18 DIAGNOSIS — R55 Syncope and collapse: Secondary | ICD-10-CM | POA: Diagnosis not present

## 2022-02-18 DIAGNOSIS — I472 Ventricular tachycardia, unspecified: Secondary | ICD-10-CM | POA: Diagnosis not present

## 2022-02-18 DIAGNOSIS — I083 Combined rheumatic disorders of mitral, aortic and tricuspid valves: Secondary | ICD-10-CM | POA: Diagnosis not present

## 2022-02-18 DIAGNOSIS — Z87891 Personal history of nicotine dependence: Secondary | ICD-10-CM | POA: Diagnosis not present

## 2022-02-18 DIAGNOSIS — I429 Cardiomyopathy, unspecified: Secondary | ICD-10-CM | POA: Diagnosis not present

## 2022-02-18 DIAGNOSIS — I5021 Acute systolic (congestive) heart failure: Secondary | ICD-10-CM | POA: Diagnosis not present

## 2022-02-18 DIAGNOSIS — I110 Hypertensive heart disease with heart failure: Secondary | ICD-10-CM | POA: Diagnosis not present

## 2022-02-18 DIAGNOSIS — E119 Type 2 diabetes mellitus without complications: Secondary | ICD-10-CM | POA: Diagnosis not present

## 2022-02-18 DIAGNOSIS — I252 Old myocardial infarction: Secondary | ICD-10-CM | POA: Diagnosis not present

## 2022-02-19 DIAGNOSIS — Z9181 History of falling: Secondary | ICD-10-CM | POA: Diagnosis not present

## 2022-02-19 DIAGNOSIS — I5021 Acute systolic (congestive) heart failure: Secondary | ICD-10-CM | POA: Diagnosis not present

## 2022-02-19 DIAGNOSIS — Z87440 Personal history of urinary (tract) infections: Secondary | ICD-10-CM | POA: Diagnosis not present

## 2022-02-19 DIAGNOSIS — I083 Combined rheumatic disorders of mitral, aortic and tricuspid valves: Secondary | ICD-10-CM | POA: Diagnosis not present

## 2022-02-19 DIAGNOSIS — I110 Hypertensive heart disease with heart failure: Secondary | ICD-10-CM | POA: Diagnosis not present

## 2022-02-19 DIAGNOSIS — I252 Old myocardial infarction: Secondary | ICD-10-CM | POA: Diagnosis not present

## 2022-02-19 DIAGNOSIS — Z87891 Personal history of nicotine dependence: Secondary | ICD-10-CM | POA: Diagnosis not present

## 2022-02-19 DIAGNOSIS — I429 Cardiomyopathy, unspecified: Secondary | ICD-10-CM | POA: Diagnosis not present

## 2022-02-19 DIAGNOSIS — K219 Gastro-esophageal reflux disease without esophagitis: Secondary | ICD-10-CM | POA: Diagnosis not present

## 2022-02-19 DIAGNOSIS — E860 Dehydration: Secondary | ICD-10-CM | POA: Diagnosis not present

## 2022-02-19 DIAGNOSIS — M1990 Unspecified osteoarthritis, unspecified site: Secondary | ICD-10-CM | POA: Diagnosis not present

## 2022-02-19 DIAGNOSIS — E119 Type 2 diabetes mellitus without complications: Secondary | ICD-10-CM | POA: Diagnosis not present

## 2022-02-19 DIAGNOSIS — Z7984 Long term (current) use of oral hypoglycemic drugs: Secondary | ICD-10-CM | POA: Diagnosis not present

## 2022-02-19 DIAGNOSIS — I472 Ventricular tachycardia, unspecified: Secondary | ICD-10-CM | POA: Diagnosis not present

## 2022-02-19 DIAGNOSIS — J449 Chronic obstructive pulmonary disease, unspecified: Secondary | ICD-10-CM | POA: Diagnosis not present

## 2022-02-19 DIAGNOSIS — Z7902 Long term (current) use of antithrombotics/antiplatelets: Secondary | ICD-10-CM | POA: Diagnosis not present

## 2022-02-19 DIAGNOSIS — I2510 Atherosclerotic heart disease of native coronary artery without angina pectoris: Secondary | ICD-10-CM | POA: Diagnosis not present

## 2022-02-19 DIAGNOSIS — E7800 Pure hypercholesterolemia, unspecified: Secondary | ICD-10-CM | POA: Diagnosis not present

## 2022-02-19 DIAGNOSIS — R55 Syncope and collapse: Secondary | ICD-10-CM | POA: Diagnosis not present

## 2022-02-19 DIAGNOSIS — E039 Hypothyroidism, unspecified: Secondary | ICD-10-CM | POA: Diagnosis not present

## 2022-02-20 DIAGNOSIS — Z9181 History of falling: Secondary | ICD-10-CM | POA: Diagnosis not present

## 2022-02-20 DIAGNOSIS — Z7984 Long term (current) use of oral hypoglycemic drugs: Secondary | ICD-10-CM | POA: Diagnosis not present

## 2022-02-20 DIAGNOSIS — M1990 Unspecified osteoarthritis, unspecified site: Secondary | ICD-10-CM | POA: Diagnosis not present

## 2022-02-20 DIAGNOSIS — J449 Chronic obstructive pulmonary disease, unspecified: Secondary | ICD-10-CM | POA: Diagnosis not present

## 2022-02-20 DIAGNOSIS — Z7902 Long term (current) use of antithrombotics/antiplatelets: Secondary | ICD-10-CM | POA: Diagnosis not present

## 2022-02-20 DIAGNOSIS — I5021 Acute systolic (congestive) heart failure: Secondary | ICD-10-CM | POA: Diagnosis not present

## 2022-02-20 DIAGNOSIS — I083 Combined rheumatic disorders of mitral, aortic and tricuspid valves: Secondary | ICD-10-CM | POA: Diagnosis not present

## 2022-02-20 DIAGNOSIS — I2510 Atherosclerotic heart disease of native coronary artery without angina pectoris: Secondary | ICD-10-CM | POA: Diagnosis not present

## 2022-02-20 DIAGNOSIS — I429 Cardiomyopathy, unspecified: Secondary | ICD-10-CM | POA: Diagnosis not present

## 2022-02-20 DIAGNOSIS — Z87440 Personal history of urinary (tract) infections: Secondary | ICD-10-CM | POA: Diagnosis not present

## 2022-02-20 DIAGNOSIS — I252 Old myocardial infarction: Secondary | ICD-10-CM | POA: Diagnosis not present

## 2022-02-20 DIAGNOSIS — I472 Ventricular tachycardia, unspecified: Secondary | ICD-10-CM | POA: Diagnosis not present

## 2022-02-20 DIAGNOSIS — K219 Gastro-esophageal reflux disease without esophagitis: Secondary | ICD-10-CM | POA: Diagnosis not present

## 2022-02-20 DIAGNOSIS — I110 Hypertensive heart disease with heart failure: Secondary | ICD-10-CM | POA: Diagnosis not present

## 2022-02-20 DIAGNOSIS — E7800 Pure hypercholesterolemia, unspecified: Secondary | ICD-10-CM | POA: Diagnosis not present

## 2022-02-20 DIAGNOSIS — R55 Syncope and collapse: Secondary | ICD-10-CM | POA: Diagnosis not present

## 2022-02-20 DIAGNOSIS — E039 Hypothyroidism, unspecified: Secondary | ICD-10-CM | POA: Diagnosis not present

## 2022-02-20 DIAGNOSIS — E860 Dehydration: Secondary | ICD-10-CM | POA: Diagnosis not present

## 2022-02-20 DIAGNOSIS — Z87891 Personal history of nicotine dependence: Secondary | ICD-10-CM | POA: Diagnosis not present

## 2022-02-20 DIAGNOSIS — E119 Type 2 diabetes mellitus without complications: Secondary | ICD-10-CM | POA: Diagnosis not present

## 2022-02-24 DIAGNOSIS — E860 Dehydration: Secondary | ICD-10-CM | POA: Diagnosis not present

## 2022-02-24 DIAGNOSIS — E7800 Pure hypercholesterolemia, unspecified: Secondary | ICD-10-CM | POA: Diagnosis not present

## 2022-02-24 DIAGNOSIS — E039 Hypothyroidism, unspecified: Secondary | ICD-10-CM | POA: Diagnosis not present

## 2022-02-24 DIAGNOSIS — K219 Gastro-esophageal reflux disease without esophagitis: Secondary | ICD-10-CM | POA: Diagnosis not present

## 2022-02-24 DIAGNOSIS — I2510 Atherosclerotic heart disease of native coronary artery without angina pectoris: Secondary | ICD-10-CM | POA: Diagnosis not present

## 2022-02-24 DIAGNOSIS — I472 Ventricular tachycardia, unspecified: Secondary | ICD-10-CM | POA: Diagnosis not present

## 2022-02-24 DIAGNOSIS — I110 Hypertensive heart disease with heart failure: Secondary | ICD-10-CM | POA: Diagnosis not present

## 2022-02-24 DIAGNOSIS — J449 Chronic obstructive pulmonary disease, unspecified: Secondary | ICD-10-CM | POA: Diagnosis not present

## 2022-02-24 DIAGNOSIS — Z7902 Long term (current) use of antithrombotics/antiplatelets: Secondary | ICD-10-CM | POA: Diagnosis not present

## 2022-02-24 DIAGNOSIS — Z7984 Long term (current) use of oral hypoglycemic drugs: Secondary | ICD-10-CM | POA: Diagnosis not present

## 2022-02-24 DIAGNOSIS — M1990 Unspecified osteoarthritis, unspecified site: Secondary | ICD-10-CM | POA: Diagnosis not present

## 2022-02-24 DIAGNOSIS — I5021 Acute systolic (congestive) heart failure: Secondary | ICD-10-CM | POA: Diagnosis not present

## 2022-02-24 DIAGNOSIS — Z9181 History of falling: Secondary | ICD-10-CM | POA: Diagnosis not present

## 2022-02-24 DIAGNOSIS — I252 Old myocardial infarction: Secondary | ICD-10-CM | POA: Diagnosis not present

## 2022-02-24 DIAGNOSIS — R55 Syncope and collapse: Secondary | ICD-10-CM | POA: Diagnosis not present

## 2022-02-24 DIAGNOSIS — I429 Cardiomyopathy, unspecified: Secondary | ICD-10-CM | POA: Diagnosis not present

## 2022-02-24 DIAGNOSIS — Z87440 Personal history of urinary (tract) infections: Secondary | ICD-10-CM | POA: Diagnosis not present

## 2022-02-24 DIAGNOSIS — I083 Combined rheumatic disorders of mitral, aortic and tricuspid valves: Secondary | ICD-10-CM | POA: Diagnosis not present

## 2022-02-24 DIAGNOSIS — Z87891 Personal history of nicotine dependence: Secondary | ICD-10-CM | POA: Diagnosis not present

## 2022-02-24 DIAGNOSIS — E119 Type 2 diabetes mellitus without complications: Secondary | ICD-10-CM | POA: Diagnosis not present

## 2022-02-25 DIAGNOSIS — I472 Ventricular tachycardia, unspecified: Secondary | ICD-10-CM | POA: Diagnosis not present

## 2022-02-25 DIAGNOSIS — I252 Old myocardial infarction: Secondary | ICD-10-CM | POA: Diagnosis not present

## 2022-02-25 DIAGNOSIS — R55 Syncope and collapse: Secondary | ICD-10-CM | POA: Diagnosis not present

## 2022-02-25 DIAGNOSIS — J449 Chronic obstructive pulmonary disease, unspecified: Secondary | ICD-10-CM | POA: Diagnosis not present

## 2022-02-25 DIAGNOSIS — Z7984 Long term (current) use of oral hypoglycemic drugs: Secondary | ICD-10-CM | POA: Diagnosis not present

## 2022-02-25 DIAGNOSIS — I083 Combined rheumatic disorders of mitral, aortic and tricuspid valves: Secondary | ICD-10-CM | POA: Diagnosis not present

## 2022-02-25 DIAGNOSIS — I110 Hypertensive heart disease with heart failure: Secondary | ICD-10-CM | POA: Diagnosis not present

## 2022-02-25 DIAGNOSIS — Z87440 Personal history of urinary (tract) infections: Secondary | ICD-10-CM | POA: Diagnosis not present

## 2022-02-25 DIAGNOSIS — Z9181 History of falling: Secondary | ICD-10-CM | POA: Diagnosis not present

## 2022-02-25 DIAGNOSIS — Z7902 Long term (current) use of antithrombotics/antiplatelets: Secondary | ICD-10-CM | POA: Diagnosis not present

## 2022-02-25 DIAGNOSIS — M1990 Unspecified osteoarthritis, unspecified site: Secondary | ICD-10-CM | POA: Diagnosis not present

## 2022-02-25 DIAGNOSIS — E119 Type 2 diabetes mellitus without complications: Secondary | ICD-10-CM | POA: Diagnosis not present

## 2022-02-25 DIAGNOSIS — K219 Gastro-esophageal reflux disease without esophagitis: Secondary | ICD-10-CM | POA: Diagnosis not present

## 2022-02-25 DIAGNOSIS — E860 Dehydration: Secondary | ICD-10-CM | POA: Diagnosis not present

## 2022-02-25 DIAGNOSIS — E039 Hypothyroidism, unspecified: Secondary | ICD-10-CM | POA: Diagnosis not present

## 2022-02-25 DIAGNOSIS — I2510 Atherosclerotic heart disease of native coronary artery without angina pectoris: Secondary | ICD-10-CM | POA: Diagnosis not present

## 2022-02-25 DIAGNOSIS — E7800 Pure hypercholesterolemia, unspecified: Secondary | ICD-10-CM | POA: Diagnosis not present

## 2022-02-25 DIAGNOSIS — Z87891 Personal history of nicotine dependence: Secondary | ICD-10-CM | POA: Diagnosis not present

## 2022-02-25 DIAGNOSIS — I5021 Acute systolic (congestive) heart failure: Secondary | ICD-10-CM | POA: Diagnosis not present

## 2022-02-25 DIAGNOSIS — I429 Cardiomyopathy, unspecified: Secondary | ICD-10-CM | POA: Diagnosis not present

## 2022-02-26 DIAGNOSIS — E7800 Pure hypercholesterolemia, unspecified: Secondary | ICD-10-CM | POA: Diagnosis not present

## 2022-02-26 DIAGNOSIS — Z7902 Long term (current) use of antithrombotics/antiplatelets: Secondary | ICD-10-CM | POA: Diagnosis not present

## 2022-02-26 DIAGNOSIS — Z87891 Personal history of nicotine dependence: Secondary | ICD-10-CM | POA: Diagnosis not present

## 2022-02-26 DIAGNOSIS — M1990 Unspecified osteoarthritis, unspecified site: Secondary | ICD-10-CM | POA: Diagnosis not present

## 2022-02-26 DIAGNOSIS — I429 Cardiomyopathy, unspecified: Secondary | ICD-10-CM | POA: Diagnosis not present

## 2022-02-26 DIAGNOSIS — I110 Hypertensive heart disease with heart failure: Secondary | ICD-10-CM | POA: Diagnosis not present

## 2022-02-26 DIAGNOSIS — I252 Old myocardial infarction: Secondary | ICD-10-CM | POA: Diagnosis not present

## 2022-02-26 DIAGNOSIS — R55 Syncope and collapse: Secondary | ICD-10-CM | POA: Diagnosis not present

## 2022-02-26 DIAGNOSIS — I472 Ventricular tachycardia, unspecified: Secondary | ICD-10-CM | POA: Diagnosis not present

## 2022-02-26 DIAGNOSIS — Z9181 History of falling: Secondary | ICD-10-CM | POA: Diagnosis not present

## 2022-02-26 DIAGNOSIS — I2510 Atherosclerotic heart disease of native coronary artery without angina pectoris: Secondary | ICD-10-CM | POA: Diagnosis not present

## 2022-02-26 DIAGNOSIS — I5021 Acute systolic (congestive) heart failure: Secondary | ICD-10-CM | POA: Diagnosis not present

## 2022-02-26 DIAGNOSIS — K219 Gastro-esophageal reflux disease without esophagitis: Secondary | ICD-10-CM | POA: Diagnosis not present

## 2022-02-26 DIAGNOSIS — I083 Combined rheumatic disorders of mitral, aortic and tricuspid valves: Secondary | ICD-10-CM | POA: Diagnosis not present

## 2022-02-26 DIAGNOSIS — Z87440 Personal history of urinary (tract) infections: Secondary | ICD-10-CM | POA: Diagnosis not present

## 2022-02-26 DIAGNOSIS — Z7984 Long term (current) use of oral hypoglycemic drugs: Secondary | ICD-10-CM | POA: Diagnosis not present

## 2022-02-26 DIAGNOSIS — J449 Chronic obstructive pulmonary disease, unspecified: Secondary | ICD-10-CM | POA: Diagnosis not present

## 2022-02-26 DIAGNOSIS — E860 Dehydration: Secondary | ICD-10-CM | POA: Diagnosis not present

## 2022-02-26 DIAGNOSIS — E119 Type 2 diabetes mellitus without complications: Secondary | ICD-10-CM | POA: Diagnosis not present

## 2022-02-26 DIAGNOSIS — E039 Hypothyroidism, unspecified: Secondary | ICD-10-CM | POA: Diagnosis not present

## 2022-03-04 DIAGNOSIS — R55 Syncope and collapse: Secondary | ICD-10-CM | POA: Diagnosis not present

## 2022-03-04 DIAGNOSIS — J449 Chronic obstructive pulmonary disease, unspecified: Secondary | ICD-10-CM | POA: Diagnosis not present

## 2022-03-04 DIAGNOSIS — E7800 Pure hypercholesterolemia, unspecified: Secondary | ICD-10-CM | POA: Diagnosis not present

## 2022-03-04 DIAGNOSIS — Z87891 Personal history of nicotine dependence: Secondary | ICD-10-CM | POA: Diagnosis not present

## 2022-03-04 DIAGNOSIS — I110 Hypertensive heart disease with heart failure: Secondary | ICD-10-CM | POA: Diagnosis not present

## 2022-03-04 DIAGNOSIS — E860 Dehydration: Secondary | ICD-10-CM | POA: Diagnosis not present

## 2022-03-04 DIAGNOSIS — K219 Gastro-esophageal reflux disease without esophagitis: Secondary | ICD-10-CM | POA: Diagnosis not present

## 2022-03-04 DIAGNOSIS — E039 Hypothyroidism, unspecified: Secondary | ICD-10-CM | POA: Diagnosis not present

## 2022-03-04 DIAGNOSIS — I252 Old myocardial infarction: Secondary | ICD-10-CM | POA: Diagnosis not present

## 2022-03-04 DIAGNOSIS — I5021 Acute systolic (congestive) heart failure: Secondary | ICD-10-CM | POA: Diagnosis not present

## 2022-03-04 DIAGNOSIS — I2510 Atherosclerotic heart disease of native coronary artery without angina pectoris: Secondary | ICD-10-CM | POA: Diagnosis not present

## 2022-03-04 DIAGNOSIS — I472 Ventricular tachycardia, unspecified: Secondary | ICD-10-CM | POA: Diagnosis not present

## 2022-03-04 DIAGNOSIS — E119 Type 2 diabetes mellitus without complications: Secondary | ICD-10-CM | POA: Diagnosis not present

## 2022-03-04 DIAGNOSIS — I083 Combined rheumatic disorders of mitral, aortic and tricuspid valves: Secondary | ICD-10-CM | POA: Diagnosis not present

## 2022-03-04 DIAGNOSIS — I429 Cardiomyopathy, unspecified: Secondary | ICD-10-CM | POA: Diagnosis not present

## 2022-03-04 DIAGNOSIS — Z7984 Long term (current) use of oral hypoglycemic drugs: Secondary | ICD-10-CM | POA: Diagnosis not present

## 2022-03-04 DIAGNOSIS — Z9181 History of falling: Secondary | ICD-10-CM | POA: Diagnosis not present

## 2022-03-04 DIAGNOSIS — Z7902 Long term (current) use of antithrombotics/antiplatelets: Secondary | ICD-10-CM | POA: Diagnosis not present

## 2022-03-04 DIAGNOSIS — Z87440 Personal history of urinary (tract) infections: Secondary | ICD-10-CM | POA: Diagnosis not present

## 2022-03-04 DIAGNOSIS — M1990 Unspecified osteoarthritis, unspecified site: Secondary | ICD-10-CM | POA: Diagnosis not present

## 2022-03-06 DIAGNOSIS — Z87440 Personal history of urinary (tract) infections: Secondary | ICD-10-CM | POA: Diagnosis not present

## 2022-03-06 DIAGNOSIS — I2510 Atherosclerotic heart disease of native coronary artery without angina pectoris: Secondary | ICD-10-CM | POA: Diagnosis not present

## 2022-03-06 DIAGNOSIS — M1990 Unspecified osteoarthritis, unspecified site: Secondary | ICD-10-CM | POA: Diagnosis not present

## 2022-03-06 DIAGNOSIS — E119 Type 2 diabetes mellitus without complications: Secondary | ICD-10-CM | POA: Diagnosis not present

## 2022-03-06 DIAGNOSIS — Z7902 Long term (current) use of antithrombotics/antiplatelets: Secondary | ICD-10-CM | POA: Diagnosis not present

## 2022-03-06 DIAGNOSIS — I472 Ventricular tachycardia, unspecified: Secondary | ICD-10-CM | POA: Diagnosis not present

## 2022-03-06 DIAGNOSIS — E860 Dehydration: Secondary | ICD-10-CM | POA: Diagnosis not present

## 2022-03-06 DIAGNOSIS — I252 Old myocardial infarction: Secondary | ICD-10-CM | POA: Diagnosis not present

## 2022-03-06 DIAGNOSIS — I110 Hypertensive heart disease with heart failure: Secondary | ICD-10-CM | POA: Diagnosis not present

## 2022-03-06 DIAGNOSIS — J449 Chronic obstructive pulmonary disease, unspecified: Secondary | ICD-10-CM | POA: Diagnosis not present

## 2022-03-06 DIAGNOSIS — E7800 Pure hypercholesterolemia, unspecified: Secondary | ICD-10-CM | POA: Diagnosis not present

## 2022-03-06 DIAGNOSIS — Z7984 Long term (current) use of oral hypoglycemic drugs: Secondary | ICD-10-CM | POA: Diagnosis not present

## 2022-03-06 DIAGNOSIS — K219 Gastro-esophageal reflux disease without esophagitis: Secondary | ICD-10-CM | POA: Diagnosis not present

## 2022-03-06 DIAGNOSIS — I083 Combined rheumatic disorders of mitral, aortic and tricuspid valves: Secondary | ICD-10-CM | POA: Diagnosis not present

## 2022-03-06 DIAGNOSIS — R55 Syncope and collapse: Secondary | ICD-10-CM | POA: Diagnosis not present

## 2022-03-06 DIAGNOSIS — I429 Cardiomyopathy, unspecified: Secondary | ICD-10-CM | POA: Diagnosis not present

## 2022-03-06 DIAGNOSIS — Z9181 History of falling: Secondary | ICD-10-CM | POA: Diagnosis not present

## 2022-03-06 DIAGNOSIS — E039 Hypothyroidism, unspecified: Secondary | ICD-10-CM | POA: Diagnosis not present

## 2022-03-06 DIAGNOSIS — Z87891 Personal history of nicotine dependence: Secondary | ICD-10-CM | POA: Diagnosis not present

## 2022-03-06 DIAGNOSIS — I5021 Acute systolic (congestive) heart failure: Secondary | ICD-10-CM | POA: Diagnosis not present

## 2022-03-11 DIAGNOSIS — I083 Combined rheumatic disorders of mitral, aortic and tricuspid valves: Secondary | ICD-10-CM | POA: Diagnosis not present

## 2022-03-11 DIAGNOSIS — Z87440 Personal history of urinary (tract) infections: Secondary | ICD-10-CM | POA: Diagnosis not present

## 2022-03-11 DIAGNOSIS — E7800 Pure hypercholesterolemia, unspecified: Secondary | ICD-10-CM | POA: Diagnosis not present

## 2022-03-11 DIAGNOSIS — E119 Type 2 diabetes mellitus without complications: Secondary | ICD-10-CM | POA: Diagnosis not present

## 2022-03-11 DIAGNOSIS — I110 Hypertensive heart disease with heart failure: Secondary | ICD-10-CM | POA: Diagnosis not present

## 2022-03-11 DIAGNOSIS — R55 Syncope and collapse: Secondary | ICD-10-CM | POA: Diagnosis not present

## 2022-03-11 DIAGNOSIS — I5021 Acute systolic (congestive) heart failure: Secondary | ICD-10-CM | POA: Diagnosis not present

## 2022-03-11 DIAGNOSIS — Z87891 Personal history of nicotine dependence: Secondary | ICD-10-CM | POA: Diagnosis not present

## 2022-03-11 DIAGNOSIS — Z9181 History of falling: Secondary | ICD-10-CM | POA: Diagnosis not present

## 2022-03-11 DIAGNOSIS — Z7984 Long term (current) use of oral hypoglycemic drugs: Secondary | ICD-10-CM | POA: Diagnosis not present

## 2022-03-11 DIAGNOSIS — J449 Chronic obstructive pulmonary disease, unspecified: Secondary | ICD-10-CM | POA: Diagnosis not present

## 2022-03-11 DIAGNOSIS — I429 Cardiomyopathy, unspecified: Secondary | ICD-10-CM | POA: Diagnosis not present

## 2022-03-11 DIAGNOSIS — Z7902 Long term (current) use of antithrombotics/antiplatelets: Secondary | ICD-10-CM | POA: Diagnosis not present

## 2022-03-11 DIAGNOSIS — E860 Dehydration: Secondary | ICD-10-CM | POA: Diagnosis not present

## 2022-03-11 DIAGNOSIS — I252 Old myocardial infarction: Secondary | ICD-10-CM | POA: Diagnosis not present

## 2022-03-11 DIAGNOSIS — E039 Hypothyroidism, unspecified: Secondary | ICD-10-CM | POA: Diagnosis not present

## 2022-03-11 DIAGNOSIS — I2510 Atherosclerotic heart disease of native coronary artery without angina pectoris: Secondary | ICD-10-CM | POA: Diagnosis not present

## 2022-03-11 DIAGNOSIS — K219 Gastro-esophageal reflux disease without esophagitis: Secondary | ICD-10-CM | POA: Diagnosis not present

## 2022-03-11 DIAGNOSIS — M1990 Unspecified osteoarthritis, unspecified site: Secondary | ICD-10-CM | POA: Diagnosis not present

## 2022-03-11 DIAGNOSIS — I472 Ventricular tachycardia, unspecified: Secondary | ICD-10-CM | POA: Diagnosis not present

## 2022-03-12 DIAGNOSIS — I2510 Atherosclerotic heart disease of native coronary artery without angina pectoris: Secondary | ICD-10-CM | POA: Diagnosis not present

## 2022-03-12 DIAGNOSIS — E119 Type 2 diabetes mellitus without complications: Secondary | ICD-10-CM | POA: Diagnosis not present

## 2022-03-12 DIAGNOSIS — Z87440 Personal history of urinary (tract) infections: Secondary | ICD-10-CM | POA: Diagnosis not present

## 2022-03-12 DIAGNOSIS — I252 Old myocardial infarction: Secondary | ICD-10-CM | POA: Diagnosis not present

## 2022-03-12 DIAGNOSIS — E039 Hypothyroidism, unspecified: Secondary | ICD-10-CM | POA: Diagnosis not present

## 2022-03-12 DIAGNOSIS — I5021 Acute systolic (congestive) heart failure: Secondary | ICD-10-CM | POA: Diagnosis not present

## 2022-03-12 DIAGNOSIS — E860 Dehydration: Secondary | ICD-10-CM | POA: Diagnosis not present

## 2022-03-12 DIAGNOSIS — I110 Hypertensive heart disease with heart failure: Secondary | ICD-10-CM | POA: Diagnosis not present

## 2022-03-12 DIAGNOSIS — I083 Combined rheumatic disorders of mitral, aortic and tricuspid valves: Secondary | ICD-10-CM | POA: Diagnosis not present

## 2022-03-12 DIAGNOSIS — M1990 Unspecified osteoarthritis, unspecified site: Secondary | ICD-10-CM | POA: Diagnosis not present

## 2022-03-12 DIAGNOSIS — Z7984 Long term (current) use of oral hypoglycemic drugs: Secondary | ICD-10-CM | POA: Diagnosis not present

## 2022-03-12 DIAGNOSIS — E7800 Pure hypercholesterolemia, unspecified: Secondary | ICD-10-CM | POA: Diagnosis not present

## 2022-03-12 DIAGNOSIS — I429 Cardiomyopathy, unspecified: Secondary | ICD-10-CM | POA: Diagnosis not present

## 2022-03-12 DIAGNOSIS — J449 Chronic obstructive pulmonary disease, unspecified: Secondary | ICD-10-CM | POA: Diagnosis not present

## 2022-03-12 DIAGNOSIS — Z9181 History of falling: Secondary | ICD-10-CM | POA: Diagnosis not present

## 2022-03-12 DIAGNOSIS — R55 Syncope and collapse: Secondary | ICD-10-CM | POA: Diagnosis not present

## 2022-03-12 DIAGNOSIS — Z87891 Personal history of nicotine dependence: Secondary | ICD-10-CM | POA: Diagnosis not present

## 2022-03-12 DIAGNOSIS — I472 Ventricular tachycardia, unspecified: Secondary | ICD-10-CM | POA: Diagnosis not present

## 2022-03-12 DIAGNOSIS — K219 Gastro-esophageal reflux disease without esophagitis: Secondary | ICD-10-CM | POA: Diagnosis not present

## 2022-03-12 DIAGNOSIS — Z7902 Long term (current) use of antithrombotics/antiplatelets: Secondary | ICD-10-CM | POA: Diagnosis not present

## 2022-03-13 DIAGNOSIS — I2510 Atherosclerotic heart disease of native coronary artery without angina pectoris: Secondary | ICD-10-CM | POA: Diagnosis not present

## 2022-03-13 DIAGNOSIS — I5022 Chronic systolic (congestive) heart failure: Secondary | ICD-10-CM | POA: Diagnosis not present

## 2022-03-13 DIAGNOSIS — E785 Hyperlipidemia, unspecified: Secondary | ICD-10-CM | POA: Diagnosis not present

## 2022-03-13 DIAGNOSIS — I340 Nonrheumatic mitral (valve) insufficiency: Secondary | ICD-10-CM | POA: Diagnosis not present

## 2022-03-13 DIAGNOSIS — I110 Hypertensive heart disease with heart failure: Secondary | ICD-10-CM | POA: Diagnosis not present

## 2022-03-13 DIAGNOSIS — I6523 Occlusion and stenosis of bilateral carotid arteries: Secondary | ICD-10-CM | POA: Diagnosis not present

## 2022-03-18 DIAGNOSIS — Z7984 Long term (current) use of oral hypoglycemic drugs: Secondary | ICD-10-CM | POA: Diagnosis not present

## 2022-03-18 DIAGNOSIS — M1990 Unspecified osteoarthritis, unspecified site: Secondary | ICD-10-CM | POA: Diagnosis not present

## 2022-03-18 DIAGNOSIS — J449 Chronic obstructive pulmonary disease, unspecified: Secondary | ICD-10-CM | POA: Diagnosis not present

## 2022-03-18 DIAGNOSIS — Z7902 Long term (current) use of antithrombotics/antiplatelets: Secondary | ICD-10-CM | POA: Diagnosis not present

## 2022-03-18 DIAGNOSIS — K219 Gastro-esophageal reflux disease without esophagitis: Secondary | ICD-10-CM | POA: Diagnosis not present

## 2022-03-18 DIAGNOSIS — Z87440 Personal history of urinary (tract) infections: Secondary | ICD-10-CM | POA: Diagnosis not present

## 2022-03-18 DIAGNOSIS — I252 Old myocardial infarction: Secondary | ICD-10-CM | POA: Diagnosis not present

## 2022-03-18 DIAGNOSIS — E039 Hypothyroidism, unspecified: Secondary | ICD-10-CM | POA: Diagnosis not present

## 2022-03-18 DIAGNOSIS — E119 Type 2 diabetes mellitus without complications: Secondary | ICD-10-CM | POA: Diagnosis not present

## 2022-03-18 DIAGNOSIS — I083 Combined rheumatic disorders of mitral, aortic and tricuspid valves: Secondary | ICD-10-CM | POA: Diagnosis not present

## 2022-03-18 DIAGNOSIS — E7800 Pure hypercholesterolemia, unspecified: Secondary | ICD-10-CM | POA: Diagnosis not present

## 2022-03-18 DIAGNOSIS — I5021 Acute systolic (congestive) heart failure: Secondary | ICD-10-CM | POA: Diagnosis not present

## 2022-03-18 DIAGNOSIS — I2510 Atherosclerotic heart disease of native coronary artery without angina pectoris: Secondary | ICD-10-CM | POA: Diagnosis not present

## 2022-03-18 DIAGNOSIS — I472 Ventricular tachycardia, unspecified: Secondary | ICD-10-CM | POA: Diagnosis not present

## 2022-03-18 DIAGNOSIS — Z87891 Personal history of nicotine dependence: Secondary | ICD-10-CM | POA: Diagnosis not present

## 2022-03-18 DIAGNOSIS — Z9181 History of falling: Secondary | ICD-10-CM | POA: Diagnosis not present

## 2022-03-18 DIAGNOSIS — I429 Cardiomyopathy, unspecified: Secondary | ICD-10-CM | POA: Diagnosis not present

## 2022-03-18 DIAGNOSIS — R55 Syncope and collapse: Secondary | ICD-10-CM | POA: Diagnosis not present

## 2022-03-18 DIAGNOSIS — E860 Dehydration: Secondary | ICD-10-CM | POA: Diagnosis not present

## 2022-03-18 DIAGNOSIS — I110 Hypertensive heart disease with heart failure: Secondary | ICD-10-CM | POA: Diagnosis not present

## 2022-03-19 DIAGNOSIS — Z7984 Long term (current) use of oral hypoglycemic drugs: Secondary | ICD-10-CM | POA: Diagnosis not present

## 2022-03-19 DIAGNOSIS — E119 Type 2 diabetes mellitus without complications: Secondary | ICD-10-CM | POA: Diagnosis not present

## 2022-03-19 DIAGNOSIS — Z9181 History of falling: Secondary | ICD-10-CM | POA: Diagnosis not present

## 2022-03-19 DIAGNOSIS — I110 Hypertensive heart disease with heart failure: Secondary | ICD-10-CM | POA: Diagnosis not present

## 2022-03-19 DIAGNOSIS — I429 Cardiomyopathy, unspecified: Secondary | ICD-10-CM | POA: Diagnosis not present

## 2022-03-19 DIAGNOSIS — E7800 Pure hypercholesterolemia, unspecified: Secondary | ICD-10-CM | POA: Diagnosis not present

## 2022-03-19 DIAGNOSIS — I083 Combined rheumatic disorders of mitral, aortic and tricuspid valves: Secondary | ICD-10-CM | POA: Diagnosis not present

## 2022-03-19 DIAGNOSIS — K219 Gastro-esophageal reflux disease without esophagitis: Secondary | ICD-10-CM | POA: Diagnosis not present

## 2022-03-19 DIAGNOSIS — R55 Syncope and collapse: Secondary | ICD-10-CM | POA: Diagnosis not present

## 2022-03-19 DIAGNOSIS — I472 Ventricular tachycardia, unspecified: Secondary | ICD-10-CM | POA: Diagnosis not present

## 2022-03-19 DIAGNOSIS — I5021 Acute systolic (congestive) heart failure: Secondary | ICD-10-CM | POA: Diagnosis not present

## 2022-03-19 DIAGNOSIS — E039 Hypothyroidism, unspecified: Secondary | ICD-10-CM | POA: Diagnosis not present

## 2022-03-19 DIAGNOSIS — Z7902 Long term (current) use of antithrombotics/antiplatelets: Secondary | ICD-10-CM | POA: Diagnosis not present

## 2022-03-19 DIAGNOSIS — Z87891 Personal history of nicotine dependence: Secondary | ICD-10-CM | POA: Diagnosis not present

## 2022-03-19 DIAGNOSIS — E860 Dehydration: Secondary | ICD-10-CM | POA: Diagnosis not present

## 2022-03-19 DIAGNOSIS — Z87440 Personal history of urinary (tract) infections: Secondary | ICD-10-CM | POA: Diagnosis not present

## 2022-03-19 DIAGNOSIS — I2510 Atherosclerotic heart disease of native coronary artery without angina pectoris: Secondary | ICD-10-CM | POA: Diagnosis not present

## 2022-03-19 DIAGNOSIS — J449 Chronic obstructive pulmonary disease, unspecified: Secondary | ICD-10-CM | POA: Diagnosis not present

## 2022-03-19 DIAGNOSIS — I252 Old myocardial infarction: Secondary | ICD-10-CM | POA: Diagnosis not present

## 2022-03-19 DIAGNOSIS — M1990 Unspecified osteoarthritis, unspecified site: Secondary | ICD-10-CM | POA: Diagnosis not present

## 2022-03-20 DIAGNOSIS — E1159 Type 2 diabetes mellitus with other circulatory complications: Secondary | ICD-10-CM | POA: Diagnosis not present

## 2022-03-20 DIAGNOSIS — I6523 Occlusion and stenosis of bilateral carotid arteries: Secondary | ICD-10-CM | POA: Diagnosis not present

## 2022-03-20 DIAGNOSIS — I10 Essential (primary) hypertension: Secondary | ICD-10-CM | POA: Diagnosis not present

## 2022-03-20 DIAGNOSIS — E785 Hyperlipidemia, unspecified: Secondary | ICD-10-CM | POA: Diagnosis not present

## 2022-03-25 DIAGNOSIS — K219 Gastro-esophageal reflux disease without esophagitis: Secondary | ICD-10-CM | POA: Diagnosis not present

## 2022-03-25 DIAGNOSIS — I5021 Acute systolic (congestive) heart failure: Secondary | ICD-10-CM | POA: Diagnosis not present

## 2022-03-25 DIAGNOSIS — Z87440 Personal history of urinary (tract) infections: Secondary | ICD-10-CM | POA: Diagnosis not present

## 2022-03-25 DIAGNOSIS — Z87891 Personal history of nicotine dependence: Secondary | ICD-10-CM | POA: Diagnosis not present

## 2022-03-25 DIAGNOSIS — E860 Dehydration: Secondary | ICD-10-CM | POA: Diagnosis not present

## 2022-03-25 DIAGNOSIS — Z7984 Long term (current) use of oral hypoglycemic drugs: Secondary | ICD-10-CM | POA: Diagnosis not present

## 2022-03-25 DIAGNOSIS — E7800 Pure hypercholesterolemia, unspecified: Secondary | ICD-10-CM | POA: Diagnosis not present

## 2022-03-25 DIAGNOSIS — J449 Chronic obstructive pulmonary disease, unspecified: Secondary | ICD-10-CM | POA: Diagnosis not present

## 2022-03-25 DIAGNOSIS — I472 Ventricular tachycardia, unspecified: Secondary | ICD-10-CM | POA: Diagnosis not present

## 2022-03-25 DIAGNOSIS — Z7902 Long term (current) use of antithrombotics/antiplatelets: Secondary | ICD-10-CM | POA: Diagnosis not present

## 2022-03-25 DIAGNOSIS — I110 Hypertensive heart disease with heart failure: Secondary | ICD-10-CM | POA: Diagnosis not present

## 2022-03-25 DIAGNOSIS — M1990 Unspecified osteoarthritis, unspecified site: Secondary | ICD-10-CM | POA: Diagnosis not present

## 2022-03-25 DIAGNOSIS — I2510 Atherosclerotic heart disease of native coronary artery without angina pectoris: Secondary | ICD-10-CM | POA: Diagnosis not present

## 2022-03-25 DIAGNOSIS — I252 Old myocardial infarction: Secondary | ICD-10-CM | POA: Diagnosis not present

## 2022-03-25 DIAGNOSIS — R55 Syncope and collapse: Secondary | ICD-10-CM | POA: Diagnosis not present

## 2022-03-25 DIAGNOSIS — I429 Cardiomyopathy, unspecified: Secondary | ICD-10-CM | POA: Diagnosis not present

## 2022-03-25 DIAGNOSIS — E039 Hypothyroidism, unspecified: Secondary | ICD-10-CM | POA: Diagnosis not present

## 2022-03-25 DIAGNOSIS — I083 Combined rheumatic disorders of mitral, aortic and tricuspid valves: Secondary | ICD-10-CM | POA: Diagnosis not present

## 2022-03-25 DIAGNOSIS — E119 Type 2 diabetes mellitus without complications: Secondary | ICD-10-CM | POA: Diagnosis not present

## 2022-03-25 DIAGNOSIS — Z9181 History of falling: Secondary | ICD-10-CM | POA: Diagnosis not present

## 2022-03-26 ENCOUNTER — Encounter: Payer: Self-pay | Admitting: Podiatry

## 2022-03-26 ENCOUNTER — Ambulatory Visit (INDEPENDENT_AMBULATORY_CARE_PROVIDER_SITE_OTHER): Payer: Medicare Other | Admitting: Podiatry

## 2022-03-26 DIAGNOSIS — M79674 Pain in right toe(s): Secondary | ICD-10-CM

## 2022-03-26 DIAGNOSIS — B351 Tinea unguium: Secondary | ICD-10-CM

## 2022-03-26 DIAGNOSIS — M79675 Pain in left toe(s): Secondary | ICD-10-CM | POA: Diagnosis not present

## 2022-03-26 DIAGNOSIS — E1140 Type 2 diabetes mellitus with diabetic neuropathy, unspecified: Secondary | ICD-10-CM

## 2022-03-26 NOTE — Progress Notes (Signed)
  Subjective:  Patient ID: Dorothy Pena, female    DOB: Feb 04, 1936,  MRN: [id number]  Dorothy Pena presents to clinic today for:  Chief Complaint  Patient presents with   Nail Problem    Diabetic foot care BS-did not check today A1C- do not know PCP-Lynn Lam PCP VST-2 months ago   New problem(s): None.   No Known Allergies  Review of Systems: Negative except as noted in the HPI.  Objective: No changes noted in today's physical examination.  Dorothy Pena is a pleasant 86 y.o. female WD, WN in NAD. AAO x 3.  Vascular Examination: CFT <3 seconds b/l LE. Faintly palpable pedal pulses b/l LE. Pedal hair sparse b/l LE. Skin temperature gradient WNL b/l. No pain with calf compression b/l. No edema b/l LE. No cyanosis or clubbing noted b/l LE.  Dermatological Examination: Pedal skin thin, shiny and atrophic b/l LE. Healing scab noted medial aspect right 2nd toe. No erythema, no edema, no drainage. No open wounds b/l LE. No interdigital macerations noted b/l LE. Toenails 1-5 b/l elongated, discolored, dystrophic, thickened, crumbly with subungual debris and tenderness to dorsal palpation. No hyperkeratoses noted on today's visit.  Musculoskeletal Examination: Muscle strength 5/5 to all lower extremity muscle groups bilaterally. HAV with bunion deformity noted b/l LE. Hammertoe deformity noted 2-5 b/l. Utilizes cane for ambulation assistance.  Neurological Examination: Pt has subjective symptoms of neuropathy. Protective sensation intact 5/5 intact bilaterally with 10g monofilament b/l. Vibratory sensation intact b/l.  Assessment/Plan: 1. Pain due to onychomycosis of toenails of both feet   2. Type 2 diabetes mellitus with diabetic neuropathy, without long-term current use of insulin ([facility])     No orders of the defined types were placed in this encounter.   -Consent given for treatment as described below: -Patient to continue soft, supportive shoe gear daily. -Toenails 1-5 b/l were  debrided in length and girth with sterile nail nippers and dremel without iatrogenic bleeding.  -Patient/POA to call should there be question/concern in the interim.   Return in about 3 months (around 06/26/2022).  Marzetta Board, DPM

## 2022-04-01 DIAGNOSIS — E7800 Pure hypercholesterolemia, unspecified: Secondary | ICD-10-CM | POA: Diagnosis not present

## 2022-04-01 DIAGNOSIS — Z87440 Personal history of urinary (tract) infections: Secondary | ICD-10-CM | POA: Diagnosis not present

## 2022-04-01 DIAGNOSIS — I2510 Atherosclerotic heart disease of native coronary artery without angina pectoris: Secondary | ICD-10-CM | POA: Diagnosis not present

## 2022-04-01 DIAGNOSIS — R55 Syncope and collapse: Secondary | ICD-10-CM | POA: Diagnosis not present

## 2022-04-01 DIAGNOSIS — E860 Dehydration: Secondary | ICD-10-CM | POA: Diagnosis not present

## 2022-04-01 DIAGNOSIS — Z87891 Personal history of nicotine dependence: Secondary | ICD-10-CM | POA: Diagnosis not present

## 2022-04-01 DIAGNOSIS — K219 Gastro-esophageal reflux disease without esophagitis: Secondary | ICD-10-CM | POA: Diagnosis not present

## 2022-04-01 DIAGNOSIS — E039 Hypothyroidism, unspecified: Secondary | ICD-10-CM | POA: Diagnosis not present

## 2022-04-01 DIAGNOSIS — E119 Type 2 diabetes mellitus without complications: Secondary | ICD-10-CM | POA: Diagnosis not present

## 2022-04-01 DIAGNOSIS — J449 Chronic obstructive pulmonary disease, unspecified: Secondary | ICD-10-CM | POA: Diagnosis not present

## 2022-04-01 DIAGNOSIS — M1990 Unspecified osteoarthritis, unspecified site: Secondary | ICD-10-CM | POA: Diagnosis not present

## 2022-04-01 DIAGNOSIS — Z7984 Long term (current) use of oral hypoglycemic drugs: Secondary | ICD-10-CM | POA: Diagnosis not present

## 2022-04-01 DIAGNOSIS — I472 Ventricular tachycardia, unspecified: Secondary | ICD-10-CM | POA: Diagnosis not present

## 2022-04-01 DIAGNOSIS — I110 Hypertensive heart disease with heart failure: Secondary | ICD-10-CM | POA: Diagnosis not present

## 2022-04-01 DIAGNOSIS — I083 Combined rheumatic disorders of mitral, aortic and tricuspid valves: Secondary | ICD-10-CM | POA: Diagnosis not present

## 2022-04-01 DIAGNOSIS — I252 Old myocardial infarction: Secondary | ICD-10-CM | POA: Diagnosis not present

## 2022-04-01 DIAGNOSIS — I429 Cardiomyopathy, unspecified: Secondary | ICD-10-CM | POA: Diagnosis not present

## 2022-04-01 DIAGNOSIS — Z9181 History of falling: Secondary | ICD-10-CM | POA: Diagnosis not present

## 2022-04-01 DIAGNOSIS — I5021 Acute systolic (congestive) heart failure: Secondary | ICD-10-CM | POA: Diagnosis not present

## 2022-04-01 DIAGNOSIS — Z7902 Long term (current) use of antithrombotics/antiplatelets: Secondary | ICD-10-CM | POA: Diagnosis not present

## 2022-04-02 ENCOUNTER — Other Ambulatory Visit: Payer: Self-pay | Admitting: Physician Assistant

## 2022-04-03 ENCOUNTER — Encounter: Payer: Self-pay | Admitting: Physician Assistant

## 2022-04-03 ENCOUNTER — Ambulatory Visit (INDEPENDENT_AMBULATORY_CARE_PROVIDER_SITE_OTHER): Payer: Medicare Other | Admitting: Physician Assistant

## 2022-04-03 VITALS — BP 178/77 | HR 95 | Wt 119.0 lb

## 2022-04-03 DIAGNOSIS — F0280 Dementia in other diseases classified elsewhere without behavioral disturbance: Secondary | ICD-10-CM | POA: Diagnosis not present

## 2022-04-03 DIAGNOSIS — F0150 Vascular dementia without behavioral disturbance: Secondary | ICD-10-CM | POA: Diagnosis not present

## 2022-04-03 DIAGNOSIS — G309 Alzheimer's disease, unspecified: Secondary | ICD-10-CM | POA: Diagnosis not present

## 2022-04-03 NOTE — Progress Notes (Signed)
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 RH female with a history of hypertension, hyperlipidemia, diabetes with bilateral diabetic neuropathy and retinopathy,, carotid artery disease, seen today in follow up for memory loss. Patient is currently on 03/2021. MRI brain personally reviewed was remarkable for  severe chronic vascular disease, as well as severe cerebral and cerebellar atrophy.  Patient is on donepezil 10 mg daily, tolerating well.  Unfortunately, she has advanced disease, there is no indication to increase dose or add any other antidementia medication as the risks outweigh the benefits.  However, since her last visit, memory seems to be about the same as prior.  Her daughter is quite frustrated about her mother's behavior, but this is expected given the advanced disease.  The hallucinations are not frightening.  There is concern that her daughter may be experiencing caregiver distress, for which the parents possibilities have been discussed, including talking with our social worker regarding community resources, caregiver counseling, possible ALF placement, Adult day program, caregiver support group etc, son agrees     Follow up in 6 months. Continue donepezil 10 mg daily Recommend set a phone call with Dorothy Pena, Social Worker, to discuss dementia counseling, including caregiver distress, community resources, etc Recommend adult day program i.e. [facility] for the patient to increase social interaction and activities and for caregiver respite Continue to control mood as per PCP Recommend good control of cardiovascular risk factors.        Subjective:    This patient is accompanied in the office by her son who supplements the history.  Previous records as well as any outside records available were reviewed prior to todays visit.    Any changes in memory since last visit? "Not  very good but about the same as prior.  "Names are okay, but conversations may be forgotten after a short while". She still enjoys going to the [employer] store to shop, but then complains that is "too much stuff in the room ". repeats oneself?  Endorsed, mostly asking same questions, which was trace her daughter, especially when asking about dead relatives which creates confrontation with her mother.  Sometimes she sees her grandchildren as babies. Disoriented when walking into a room?  "Sometimes she doesn't know where she lives " Leaving objects in unusual places?  Patient denies   Ambulates  with difficulty?  Some difficulty, not worse, she finished PT OT, but did not keep up with the exercises, and she has "no confidence again ", "if you take her to [place] just watch her " Recent falls?  Patient denies but she hesitates to walk, does not like to use a walker    Any head injuries?  Patient denies   History of seizures?   Patient denies   Wandering behavior?  Patient denies   Patient drives?   Patient no longer drives  Any mood changes since last visit?  Any worsening depression?:  Patient denies   Hallucinations?  Endorsed,  such as seeing her granddaughter as a baby still but the reality age of 6, sometimes she asks about her dad, not knowing that he is dead.  This creates significant frustration in her daughter, as described by her son.  She does not see any moving objects, or antibiotics. Paranoia?  " In the morning, first thing she asks  I need to go to the bank, who is getting my check, who is getting  my money?   Patient reports that sleeps well without vivid dreams, REM behavior or sleepwalking   History of sleep apnea?  Patient denies   Any hygiene concerns?  As before, she continues to refuse taking a shower, or changing her clothes, again creating significant frustration in her daughter. Independent of bathing and dressing?  Endorsed  Does the patient needs help  with medications?  Denies Who is in charge of the finances?  Family is in charge    Any changes in appetite?  .  Her son reports that she does not drink enough water but doing better than before .  "Sometimes she forgets that she ate and asked where is my food?  " Patient have trouble swallowing? Patient denies   Does the patient cook?  Patient denies   Any kitchen accidents such as leaving the stove on? Patient denies   Any headaches?  Patient denies   Double vision? Patient denies   Any focal numbness or tingling?  Patient denies   Chronic back pain Patient denies   Unilateral weakness?  Patient denies   Any tremors?  Patient denies   Any history of anosmia?  Patient denies   Any incontinence of urine?  Endorsed.  She wears diapers. Any bowel dysfunction?   Patient denies      Patient lives with: Her daughter   PREVIOUS MEDICATIONS:   CURRENT MEDICATIONS:  Outpatient Encounter Medications as of 04/03/2022  Medication Sig   carvedilol (COREG) 25 MG tablet Take 25 mg by mouth 2 (two) times daily with a meal.   cetirizine (ZYRTEC) 10 MG tablet Take 20 mg by mouth daily.   clopidogrel (PLAVIX) 75 MG tablet Take 1 tablet by mouth daily.   digoxin (LANOXIN) 0.125 MG tablet Take 0.125 mg by mouth daily.   donepezil (ARICEPT) 10 MG tablet Take half tablet (5 mg) daily for 2 weeks, then increase to the full tablet at 10 mg daily   furosemide (LASIX) 20 MG tablet Take by mouth.   glimepiride (AMARYL) 4 MG tablet Take 4 mg by mouth 2 (two) times daily.   ketorolac (ACULAR) 0.5 % ophthalmic solution Place 1 drop into the left eye 4 (four) times daily.   levothyroxine (SYNTHROID) 50 MCG tablet Take 50 mcg by mouth daily.   levothyroxine (SYNTHROID) 75 MCG tablet Take 75 mcg by mouth daily.   lisinopril (ZESTRIL) 10 MG tablet Take 0.5 tablets by mouth daily.   megestrol (MEGACE) 40 MG/ML suspension Take 200 mg by mouth daily.   metFORMIN (GLUCOPHAGE) 500 MG tablet Take 500 mg by mouth 2 (two)  times daily.   mupirocin ointment (BACTROBAN) 2 % Apply 1 application topically daily.   nitroGLYCERIN (NITROSTAT) 0.4 MG SL tablet Place under the tongue.   ofloxacin (OCUFLOX) 0.3 % ophthalmic solution Place 1 drop into the left eye 4 (four) times daily.   prednisoLONE acetate (PRED FORTE) 1 % ophthalmic suspension Place 1 drop into the left eye 4 (four) times daily.   ranolazine (RANEXA) 500 MG 12 hr tablet Take 1 tablet by mouth 2 (two) times daily.   rosuvastatin (CRESTOR) 20 MG tablet Take 1 tablet (20 mg total) by mouth daily.   traMADol (ULTRAM) 50 MG tablet Take 50 mg by mouth every 4 (four) hours as needed.   triamcinolone cream (KENALOG) 0.5 % Apply topically 2 (two) times daily.   Vitamin D, Ergocalciferol, (DRISDOL) 50000 UNITS CAPS capsule Take 50,000 Units by mouth every 7 (seven) days. On Sunday  No facility-administered encounter medications on file as of 04/03/2022.       03/12/2021    3:00 PM  MMSE - Mini Mental State Exam  Orientation to time 2  Orientation to Place 3  Registration 3  Attention/ Calculation 3  Recall 0  Language- name 2 objects 2  Language- repeat 1  Language- follow 3 step command 3  Language- read & follow direction 1  Write a sentence 1  Copy design 0  Total score 19       No data to display          Objective:     PHYSICAL EXAMINATION:    VITALS:   Vitals:   04/03/22 1119  BP: (!) 178/77  Pulse: 95  SpO2: 96%  Weight: 119 lb (54 kg)    GEN:  The patient appears stated age and is in NAD. HEENT:  Normocephalic, atraumatic.   Neurological examination:  General: NAD, well-groomed, appears stated age. Orientation: The patient is alert. Oriented to person not to place or date. Cranial nerves: There is good facial symmetry.The speech is fluent and clear. No aphasia or dysarthria. Fund of knowledge is reduced. Recent and remote memory are impaired. Attention and concentration are reduced.  Unable to name objects and repeat  phrases.  Hearing is intact to conversational tone.    Sensation: Sensation is intact to light touch throughout Motor: Strength is at least antigravity x4. Tremors: none  DTR's 2/4 in UE/LE     Movement examination: Tone: There is normal tone in the UE/LE Abnormal movements:  no tremor.  No myoclonus.  No asterixis.   Coordination:  There is no decremation with RAM's. Normal finger to nose  Gait and Station: The patient has no difficulty arising out of a deep-seated chair without the use of the hands. The patient's stride length is good.  Gait is cautious and narrow.    Thank you for allowing [place] the opportunity to participate in the care of this nice patient. Please do not hesitate to contact us for any questions or concerns.   Total time spent on today's visit was 33 minutes dedicated to this patient today, preparing to see patient, examining the patient, ordering tests and/or medications and counseling the patient, documenting clinical information in the EHR or other health record, independently interpreting results and communicating results to the patient/family, discussing treatment and goals, answering patient's questions and coordinating care.  Cc:  Philmore Pali, NP (Inactive)  Sharene Butters 04/03/2022 11:43 AM

## 2022-04-03 NOTE — Patient Instructions (Signed)
It was a pleasure to see you today at our office.   Recommendations:  Follow up in  6 months Continue donepezil 10 mg daily  Consider caregiver support group    Whom to call:  Memory  decline, memory medications: Call our office [phone]   For psychiatric meds, mood meds: Please have your primary care physician manage these medications.   Counseling regarding caregiver distress, including caregiver depression, anxiety and issues regarding community resources, adult day care programs, adult living facilities, or memory care questions:   Feel free to contact [place], Social Worker at    For assessment of decision of mental capacity and competency:  Call Dr. Anthoney Harada, geriatric psychiatrist at [phone]  For guidance in geriatric dementia issues please call [facility] [phone]  For guidance regarding [facility] and if placement were needed at the facility, contact Arnell Asal, Social Worker tel: [phone]  If you have any severe symptoms of a stroke, or other severe issues such as confusion,severe chills or fever, etc call 911 or go to the ER as you may need to be evaluated further   Feel free to visit Facebook page " [email or web address]" for tips of how to care for people with memory problems.      RECOMMENDATIONS FOR ALL PATIENTS WITH MEMORY PROBLEMS: 1. Continue to exercise (Recommend 30 minutes of walking everyday, or 3 hours every week) 2. Increase social interactions - continue going to [place] and enjoy social gatherings with friends and family 3. Eat healthy, avoid fried foods and eat more fruits and vegetables 4. Maintain adequate blood pressure, blood sugar, and blood cholesterol level. Reducing the risk of stroke and cardiovascular disease also helps promoting better memory. 5. Avoid stressful situations. Live a simple life and avoid aggravations. Organize your time and prepare for the next day in anticipation. 6.  Sleep well, avoid any interruptions of sleep and avoid any distractions in the bedroom that may interfere with adequate sleep quality 7. Avoid sugar, avoid sweets as there is a strong link between excessive sugar intake, diabetes, and cognitive impairment We discussed the Mediterranean diet, which has been shown to help patients reduce the risk of progressive memory disorders and reduces cardiovascular risk. This includes eating fish, eat fruits and green leafy vegetables, nuts like almonds and hazelnuts, walnuts, and also use olive oil. Avoid fast foods and fried foods as much as possible. Avoid sweets and sugar as sugar use has been linked to worsening of memory function.  There is always a concern of gradual progression of memory problems. If this is the case, then we may need to adjust level of care according to patient needs. Support, both to the patient and caregiver, should then be put into place.    FALL PRECAUTIONS: Be cautious when walking. Scan the area for obstacles that may increase the risk of trips and falls. When getting up in the mornings, sit up at the edge of the bed for a few minutes before getting out of bed. Consider elevating the bed at the head end to avoid drop of blood pressure when getting up. Walk always in a well-lit room (use night lights in the walls). Avoid area rugs or power cords from appliances in the middle of the walkways. Use a walker or a cane if necessary and consider physical therapy for balance exercise. Get your eyesight checked regularly.  FINANCIAL OVERSIGHT: Supervision, especially oversight when making financial decisions or transactions is also recommended.  HOME SAFETY: Consider  the safety of the kitchen when operating appliances like stoves, microwave oven, and blender. Consider having supervision and share cooking responsibilities until no longer able to participate in those. Accidents with firearms and other hazards in the house should be identified and  addressed as well.   ABILITY TO BE LEFT ALONE: If patient is unable to contact 911 operator, consider using LifeLine, or when the need is there, arrange for someone to stay with patients. Smoking is a fire hazard, consider supervision or cessation. Risk of wandering should be assessed by caregiver and if detected at any point, supervision and safe proof recommendations should be instituted.  MEDICATION SUPERVISION: Inability to self-administer medication needs to be constantly addressed. Implement a mechanism to ensure safe administration of the medications.   DRIVING: Regarding driving, in patients with progressive memory problems, driving will be impaired. We advise to have someone else do the driving if trouble finding directions or if minor accidents are reported. Independent driving assessment is available to determine safety of driving.   If you are interested in the driving assessment, you can contact the following:  [employer] in [place]  [place] [phone]  [place]  [facility] [phone] or [phone]

## 2022-04-13 DIAGNOSIS — E559 Vitamin D deficiency, unspecified: Secondary | ICD-10-CM | POA: Diagnosis not present

## 2022-04-13 DIAGNOSIS — I5022 Chronic systolic (congestive) heart failure: Secondary | ICD-10-CM | POA: Diagnosis not present

## 2022-04-13 DIAGNOSIS — E039 Hypothyroidism, unspecified: Secondary | ICD-10-CM | POA: Diagnosis not present

## 2022-04-13 DIAGNOSIS — Z23 Encounter for immunization: Secondary | ICD-10-CM | POA: Diagnosis not present

## 2022-04-13 DIAGNOSIS — E7800 Pure hypercholesterolemia, unspecified: Secondary | ICD-10-CM | POA: Diagnosis not present

## 2022-04-13 DIAGNOSIS — I10 Essential (primary) hypertension: Secondary | ICD-10-CM | POA: Diagnosis not present

## 2022-04-13 DIAGNOSIS — E1149 Type 2 diabetes mellitus with other diabetic neurological complication: Secondary | ICD-10-CM | POA: Diagnosis not present

## 2022-04-29 DIAGNOSIS — I340 Nonrheumatic mitral (valve) insufficiency: Secondary | ICD-10-CM | POA: Diagnosis not present

## 2022-04-29 DIAGNOSIS — I5022 Chronic systolic (congestive) heart failure: Secondary | ICD-10-CM | POA: Diagnosis not present

## 2022-05-04 DIAGNOSIS — N390 Urinary tract infection, site not specified: Secondary | ICD-10-CM | POA: Diagnosis not present

## 2022-05-04 DIAGNOSIS — S59911A Unspecified injury of right forearm, initial encounter: Secondary | ICD-10-CM | POA: Diagnosis not present

## 2022-05-04 DIAGNOSIS — I6523 Occlusion and stenosis of bilateral carotid arteries: Secondary | ICD-10-CM | POA: Diagnosis not present

## 2022-05-04 DIAGNOSIS — S4991XA Unspecified injury of right shoulder and upper arm, initial encounter: Secondary | ICD-10-CM | POA: Diagnosis not present

## 2022-05-04 DIAGNOSIS — G8321 Monoplegia of upper limb affecting right dominant side: Secondary | ICD-10-CM | POA: Diagnosis not present

## 2022-05-04 DIAGNOSIS — J449 Chronic obstructive pulmonary disease, unspecified: Secondary | ICD-10-CM | POA: Diagnosis not present

## 2022-05-04 DIAGNOSIS — I69314 Frontal lobe and executive function deficit following cerebral infarction: Secondary | ICD-10-CM | POA: Diagnosis not present

## 2022-05-04 DIAGNOSIS — Z79899 Other long term (current) drug therapy: Secondary | ICD-10-CM | POA: Diagnosis not present

## 2022-05-04 DIAGNOSIS — R29898 Other symptoms and signs involving the musculoskeletal system: Secondary | ICD-10-CM | POA: Diagnosis not present

## 2022-05-04 DIAGNOSIS — G9340 Encephalopathy, unspecified: Secondary | ICD-10-CM | POA: Diagnosis not present

## 2022-05-04 DIAGNOSIS — G301 Alzheimer's disease with late onset: Secondary | ICD-10-CM | POA: Diagnosis not present

## 2022-05-04 DIAGNOSIS — E039 Hypothyroidism, unspecified: Secondary | ICD-10-CM | POA: Insufficient documentation

## 2022-05-04 DIAGNOSIS — I420 Dilated cardiomyopathy: Secondary | ICD-10-CM | POA: Diagnosis not present

## 2022-05-04 DIAGNOSIS — N3000 Acute cystitis without hematuria: Secondary | ICD-10-CM | POA: Diagnosis not present

## 2022-05-04 DIAGNOSIS — F0390 Unspecified dementia without behavioral disturbance: Secondary | ICD-10-CM | POA: Insufficient documentation

## 2022-05-04 DIAGNOSIS — M50221 Other cervical disc displacement at C4-C5 level: Secondary | ICD-10-CM | POA: Diagnosis not present

## 2022-05-04 DIAGNOSIS — I2510 Atherosclerotic heart disease of native coronary artery without angina pectoris: Secondary | ICD-10-CM | POA: Diagnosis not present

## 2022-05-04 DIAGNOSIS — R29717 NIHSS score 17: Secondary | ICD-10-CM | POA: Diagnosis not present

## 2022-05-04 DIAGNOSIS — E7800 Pure hypercholesterolemia, unspecified: Secondary | ICD-10-CM | POA: Diagnosis not present

## 2022-05-04 DIAGNOSIS — I618 Other nontraumatic intracerebral hemorrhage: Secondary | ICD-10-CM | POA: Diagnosis not present

## 2022-05-04 DIAGNOSIS — G928 Other toxic encephalopathy: Secondary | ICD-10-CM | POA: Diagnosis not present

## 2022-05-04 DIAGNOSIS — E119 Type 2 diabetes mellitus without complications: Secondary | ICD-10-CM | POA: Diagnosis not present

## 2022-05-04 DIAGNOSIS — F02811 Dementia in other diseases classified elsewhere, unspecified severity, with agitation: Secondary | ICD-10-CM | POA: Diagnosis not present

## 2022-05-04 DIAGNOSIS — I639 Cerebral infarction, unspecified: Secondary | ICD-10-CM | POA: Diagnosis not present

## 2022-05-04 DIAGNOSIS — I10 Essential (primary) hypertension: Secondary | ICD-10-CM | POA: Diagnosis not present

## 2022-05-05 DIAGNOSIS — G9340 Encephalopathy, unspecified: Secondary | ICD-10-CM | POA: Diagnosis not present

## 2022-05-05 DIAGNOSIS — I2510 Atherosclerotic heart disease of native coronary artery without angina pectoris: Secondary | ICD-10-CM | POA: Diagnosis not present

## 2022-05-05 DIAGNOSIS — N390 Urinary tract infection, site not specified: Secondary | ICD-10-CM | POA: Diagnosis not present

## 2022-05-05 DIAGNOSIS — I10 Essential (primary) hypertension: Secondary | ICD-10-CM | POA: Diagnosis not present

## 2022-05-05 DIAGNOSIS — I639 Cerebral infarction, unspecified: Secondary | ICD-10-CM | POA: Diagnosis not present

## 2022-05-05 DIAGNOSIS — E119 Type 2 diabetes mellitus without complications: Secondary | ICD-10-CM | POA: Diagnosis not present

## 2022-05-05 DIAGNOSIS — Z79899 Other long term (current) drug therapy: Secondary | ICD-10-CM | POA: Diagnosis not present

## 2022-05-05 DIAGNOSIS — E039 Hypothyroidism, unspecified: Secondary | ICD-10-CM | POA: Diagnosis not present

## 2022-05-06 DIAGNOSIS — I69314 Frontal lobe and executive function deficit following cerebral infarction: Secondary | ICD-10-CM | POA: Diagnosis not present

## 2022-05-06 DIAGNOSIS — Z79899 Other long term (current) drug therapy: Secondary | ICD-10-CM | POA: Diagnosis not present

## 2022-05-06 DIAGNOSIS — I10 Essential (primary) hypertension: Secondary | ICD-10-CM | POA: Diagnosis not present

## 2022-05-06 DIAGNOSIS — G9340 Encephalopathy, unspecified: Secondary | ICD-10-CM | POA: Diagnosis not present

## 2022-05-06 DIAGNOSIS — N390 Urinary tract infection, site not specified: Secondary | ICD-10-CM | POA: Diagnosis not present

## 2022-05-06 DIAGNOSIS — E039 Hypothyroidism, unspecified: Secondary | ICD-10-CM | POA: Diagnosis not present

## 2022-05-06 DIAGNOSIS — E119 Type 2 diabetes mellitus without complications: Secondary | ICD-10-CM | POA: Diagnosis not present

## 2022-05-06 DIAGNOSIS — I2510 Atherosclerotic heart disease of native coronary artery without angina pectoris: Secondary | ICD-10-CM | POA: Diagnosis not present

## 2022-05-07 DIAGNOSIS — R5381 Other malaise: Secondary | ICD-10-CM | POA: Diagnosis not present

## 2022-05-07 DIAGNOSIS — Z79899 Other long term (current) drug therapy: Secondary | ICD-10-CM | POA: Diagnosis not present

## 2022-05-07 DIAGNOSIS — N390 Urinary tract infection, site not specified: Secondary | ICD-10-CM | POA: Diagnosis not present

## 2022-05-07 DIAGNOSIS — J449 Chronic obstructive pulmonary disease, unspecified: Secondary | ICD-10-CM | POA: Diagnosis not present

## 2022-05-07 DIAGNOSIS — I420 Dilated cardiomyopathy: Secondary | ICD-10-CM | POA: Diagnosis not present

## 2022-05-07 DIAGNOSIS — I2510 Atherosclerotic heart disease of native coronary artery without angina pectoris: Secondary | ICD-10-CM | POA: Diagnosis not present

## 2022-05-07 DIAGNOSIS — R799 Abnormal finding of blood chemistry, unspecified: Secondary | ICD-10-CM | POA: Diagnosis not present

## 2022-05-07 DIAGNOSIS — F02811 Dementia in other diseases classified elsewhere, unspecified severity, with agitation: Secondary | ICD-10-CM | POA: Diagnosis not present

## 2022-05-07 DIAGNOSIS — G301 Alzheimer's disease with late onset: Secondary | ICD-10-CM | POA: Diagnosis not present

## 2022-05-07 DIAGNOSIS — E119 Type 2 diabetes mellitus without complications: Secondary | ICD-10-CM | POA: Diagnosis not present

## 2022-05-07 DIAGNOSIS — Z8673 Personal history of transient ischemic attack (TIA), and cerebral infarction without residual deficits: Secondary | ICD-10-CM | POA: Diagnosis not present

## 2022-05-07 DIAGNOSIS — I10 Essential (primary) hypertension: Secondary | ICD-10-CM | POA: Diagnosis not present

## 2022-05-07 DIAGNOSIS — G928 Other toxic encephalopathy: Secondary | ICD-10-CM | POA: Diagnosis not present

## 2022-05-07 DIAGNOSIS — N3000 Acute cystitis without hematuria: Secondary | ICD-10-CM | POA: Diagnosis not present

## 2022-05-07 DIAGNOSIS — E039 Hypothyroidism, unspecified: Secondary | ICD-10-CM | POA: Diagnosis not present

## 2022-05-07 DIAGNOSIS — R29717 NIHSS score 17: Secondary | ICD-10-CM | POA: Diagnosis not present

## 2022-05-07 DIAGNOSIS — I6523 Occlusion and stenosis of bilateral carotid arteries: Secondary | ICD-10-CM | POA: Diagnosis not present

## 2022-05-07 DIAGNOSIS — G9340 Encephalopathy, unspecified: Secondary | ICD-10-CM | POA: Diagnosis not present

## 2022-05-07 DIAGNOSIS — G8321 Monoplegia of upper limb affecting right dominant side: Secondary | ICD-10-CM | POA: Diagnosis not present

## 2022-05-07 DIAGNOSIS — I618 Other nontraumatic intracerebral hemorrhage: Secondary | ICD-10-CM | POA: Diagnosis not present

## 2022-05-07 DIAGNOSIS — E7800 Pure hypercholesterolemia, unspecified: Secondary | ICD-10-CM | POA: Diagnosis not present

## 2022-05-07 DIAGNOSIS — I69314 Frontal lobe and executive function deficit following cerebral infarction: Secondary | ICD-10-CM | POA: Diagnosis not present

## 2022-05-08 DIAGNOSIS — R5381 Other malaise: Secondary | ICD-10-CM | POA: Diagnosis not present

## 2022-05-08 DIAGNOSIS — N390 Urinary tract infection, site not specified: Secondary | ICD-10-CM | POA: Diagnosis not present

## 2022-05-08 DIAGNOSIS — E119 Type 2 diabetes mellitus without complications: Secondary | ICD-10-CM | POA: Diagnosis not present

## 2022-05-08 DIAGNOSIS — Z8673 Personal history of transient ischemic attack (TIA), and cerebral infarction without residual deficits: Secondary | ICD-10-CM | POA: Diagnosis not present

## 2022-05-11 DIAGNOSIS — Z8673 Personal history of transient ischemic attack (TIA), and cerebral infarction without residual deficits: Secondary | ICD-10-CM | POA: Diagnosis not present

## 2022-05-11 DIAGNOSIS — R5381 Other malaise: Secondary | ICD-10-CM | POA: Diagnosis not present

## 2022-05-11 DIAGNOSIS — E119 Type 2 diabetes mellitus without complications: Secondary | ICD-10-CM | POA: Diagnosis not present

## 2022-05-11 DIAGNOSIS — I2510 Atherosclerotic heart disease of native coronary artery without angina pectoris: Secondary | ICD-10-CM | POA: Diagnosis not present

## 2022-05-13 DIAGNOSIS — E119 Type 2 diabetes mellitus without complications: Secondary | ICD-10-CM | POA: Diagnosis not present

## 2022-05-13 DIAGNOSIS — I10 Essential (primary) hypertension: Secondary | ICD-10-CM | POA: Diagnosis not present

## 2022-05-13 DIAGNOSIS — E039 Hypothyroidism, unspecified: Secondary | ICD-10-CM | POA: Diagnosis not present

## 2022-05-13 DIAGNOSIS — R5381 Other malaise: Secondary | ICD-10-CM | POA: Diagnosis not present

## 2022-05-21 DIAGNOSIS — E1149 Type 2 diabetes mellitus with other diabetic neurological complication: Secondary | ICD-10-CM | POA: Diagnosis not present

## 2022-05-21 DIAGNOSIS — I5022 Chronic systolic (congestive) heart failure: Secondary | ICD-10-CM | POA: Diagnosis not present

## 2022-06-11 DIAGNOSIS — F03918 Unspecified dementia, unspecified severity, with other behavioral disturbance: Secondary | ICD-10-CM | POA: Diagnosis not present

## 2022-06-11 DIAGNOSIS — E039 Hypothyroidism, unspecified: Secondary | ICD-10-CM | POA: Diagnosis not present

## 2022-06-11 DIAGNOSIS — I2510 Atherosclerotic heart disease of native coronary artery without angina pectoris: Secondary | ICD-10-CM | POA: Diagnosis not present

## 2022-06-11 DIAGNOSIS — Z87440 Personal history of urinary (tract) infections: Secondary | ICD-10-CM | POA: Diagnosis not present

## 2022-06-11 DIAGNOSIS — Z7984 Long term (current) use of oral hypoglycemic drugs: Secondary | ICD-10-CM | POA: Diagnosis not present

## 2022-06-11 DIAGNOSIS — Z8673 Personal history of transient ischemic attack (TIA), and cerebral infarction without residual deficits: Secondary | ICD-10-CM | POA: Diagnosis not present

## 2022-06-11 DIAGNOSIS — R5381 Other malaise: Secondary | ICD-10-CM | POA: Diagnosis not present

## 2022-06-11 DIAGNOSIS — E119 Type 2 diabetes mellitus without complications: Secondary | ICD-10-CM | POA: Diagnosis not present

## 2022-06-11 DIAGNOSIS — I10 Essential (primary) hypertension: Secondary | ICD-10-CM | POA: Diagnosis not present

## 2022-06-11 DIAGNOSIS — Z7902 Long term (current) use of antithrombotics/antiplatelets: Secondary | ICD-10-CM | POA: Diagnosis not present

## 2022-06-11 DIAGNOSIS — E7800 Pure hypercholesterolemia, unspecified: Secondary | ICD-10-CM | POA: Diagnosis not present

## 2022-06-16 DIAGNOSIS — F03918 Unspecified dementia, unspecified severity, with other behavioral disturbance: Secondary | ICD-10-CM | POA: Diagnosis not present

## 2022-06-16 DIAGNOSIS — I2510 Atherosclerotic heart disease of native coronary artery without angina pectoris: Secondary | ICD-10-CM | POA: Diagnosis not present

## 2022-06-16 DIAGNOSIS — Z7984 Long term (current) use of oral hypoglycemic drugs: Secondary | ICD-10-CM | POA: Diagnosis not present

## 2022-06-16 DIAGNOSIS — Z7902 Long term (current) use of antithrombotics/antiplatelets: Secondary | ICD-10-CM | POA: Diagnosis not present

## 2022-06-16 DIAGNOSIS — E119 Type 2 diabetes mellitus without complications: Secondary | ICD-10-CM | POA: Diagnosis not present

## 2022-06-16 DIAGNOSIS — Z8673 Personal history of transient ischemic attack (TIA), and cerebral infarction without residual deficits: Secondary | ICD-10-CM | POA: Diagnosis not present

## 2022-06-16 DIAGNOSIS — E039 Hypothyroidism, unspecified: Secondary | ICD-10-CM | POA: Diagnosis not present

## 2022-06-16 DIAGNOSIS — Z87440 Personal history of urinary (tract) infections: Secondary | ICD-10-CM | POA: Diagnosis not present

## 2022-06-16 DIAGNOSIS — E7800 Pure hypercholesterolemia, unspecified: Secondary | ICD-10-CM | POA: Diagnosis not present

## 2022-06-16 DIAGNOSIS — R5381 Other malaise: Secondary | ICD-10-CM | POA: Diagnosis not present

## 2022-06-16 DIAGNOSIS — I10 Essential (primary) hypertension: Secondary | ICD-10-CM | POA: Diagnosis not present

## 2022-06-18 DIAGNOSIS — Z87440 Personal history of urinary (tract) infections: Secondary | ICD-10-CM | POA: Diagnosis not present

## 2022-06-18 DIAGNOSIS — I10 Essential (primary) hypertension: Secondary | ICD-10-CM | POA: Diagnosis not present

## 2022-06-18 DIAGNOSIS — F03918 Unspecified dementia, unspecified severity, with other behavioral disturbance: Secondary | ICD-10-CM | POA: Diagnosis not present

## 2022-06-18 DIAGNOSIS — R5381 Other malaise: Secondary | ICD-10-CM | POA: Diagnosis not present

## 2022-06-18 DIAGNOSIS — Z8673 Personal history of transient ischemic attack (TIA), and cerebral infarction without residual deficits: Secondary | ICD-10-CM | POA: Diagnosis not present

## 2022-06-18 DIAGNOSIS — I2510 Atherosclerotic heart disease of native coronary artery without angina pectoris: Secondary | ICD-10-CM | POA: Diagnosis not present

## 2022-06-18 DIAGNOSIS — E7800 Pure hypercholesterolemia, unspecified: Secondary | ICD-10-CM | POA: Diagnosis not present

## 2022-06-18 DIAGNOSIS — Z7902 Long term (current) use of antithrombotics/antiplatelets: Secondary | ICD-10-CM | POA: Diagnosis not present

## 2022-06-18 DIAGNOSIS — E119 Type 2 diabetes mellitus without complications: Secondary | ICD-10-CM | POA: Diagnosis not present

## 2022-06-18 DIAGNOSIS — E039 Hypothyroidism, unspecified: Secondary | ICD-10-CM | POA: Diagnosis not present

## 2022-06-18 DIAGNOSIS — Z7984 Long term (current) use of oral hypoglycemic drugs: Secondary | ICD-10-CM | POA: Diagnosis not present

## 2022-06-23 DIAGNOSIS — Z7902 Long term (current) use of antithrombotics/antiplatelets: Secondary | ICD-10-CM | POA: Diagnosis not present

## 2022-06-23 DIAGNOSIS — Z87440 Personal history of urinary (tract) infections: Secondary | ICD-10-CM | POA: Diagnosis not present

## 2022-06-23 DIAGNOSIS — E119 Type 2 diabetes mellitus without complications: Secondary | ICD-10-CM | POA: Diagnosis not present

## 2022-06-23 DIAGNOSIS — E7800 Pure hypercholesterolemia, unspecified: Secondary | ICD-10-CM | POA: Diagnosis not present

## 2022-06-23 DIAGNOSIS — I2510 Atherosclerotic heart disease of native coronary artery without angina pectoris: Secondary | ICD-10-CM | POA: Diagnosis not present

## 2022-06-23 DIAGNOSIS — I10 Essential (primary) hypertension: Secondary | ICD-10-CM | POA: Diagnosis not present

## 2022-06-23 DIAGNOSIS — Z8673 Personal history of transient ischemic attack (TIA), and cerebral infarction without residual deficits: Secondary | ICD-10-CM | POA: Diagnosis not present

## 2022-06-23 DIAGNOSIS — R5381 Other malaise: Secondary | ICD-10-CM | POA: Diagnosis not present

## 2022-06-23 DIAGNOSIS — Z7984 Long term (current) use of oral hypoglycemic drugs: Secondary | ICD-10-CM | POA: Diagnosis not present

## 2022-06-23 DIAGNOSIS — F03918 Unspecified dementia, unspecified severity, with other behavioral disturbance: Secondary | ICD-10-CM | POA: Diagnosis not present

## 2022-06-23 DIAGNOSIS — E039 Hypothyroidism, unspecified: Secondary | ICD-10-CM | POA: Diagnosis not present

## 2022-06-30 DIAGNOSIS — I2510 Atherosclerotic heart disease of native coronary artery without angina pectoris: Secondary | ICD-10-CM | POA: Diagnosis not present

## 2022-06-30 DIAGNOSIS — E039 Hypothyroidism, unspecified: Secondary | ICD-10-CM | POA: Diagnosis not present

## 2022-06-30 DIAGNOSIS — E119 Type 2 diabetes mellitus without complications: Secondary | ICD-10-CM | POA: Diagnosis not present

## 2022-06-30 DIAGNOSIS — E7800 Pure hypercholesterolemia, unspecified: Secondary | ICD-10-CM | POA: Diagnosis not present

## 2022-06-30 DIAGNOSIS — Z87440 Personal history of urinary (tract) infections: Secondary | ICD-10-CM | POA: Diagnosis not present

## 2022-06-30 DIAGNOSIS — Z8673 Personal history of transient ischemic attack (TIA), and cerebral infarction without residual deficits: Secondary | ICD-10-CM | POA: Diagnosis not present

## 2022-06-30 DIAGNOSIS — F03918 Unspecified dementia, unspecified severity, with other behavioral disturbance: Secondary | ICD-10-CM | POA: Diagnosis not present

## 2022-06-30 DIAGNOSIS — Z7902 Long term (current) use of antithrombotics/antiplatelets: Secondary | ICD-10-CM | POA: Diagnosis not present

## 2022-06-30 DIAGNOSIS — Z7984 Long term (current) use of oral hypoglycemic drugs: Secondary | ICD-10-CM | POA: Diagnosis not present

## 2022-06-30 DIAGNOSIS — I10 Essential (primary) hypertension: Secondary | ICD-10-CM | POA: Diagnosis not present

## 2022-06-30 DIAGNOSIS — R5381 Other malaise: Secondary | ICD-10-CM | POA: Diagnosis not present

## 2022-07-07 DIAGNOSIS — I2510 Atherosclerotic heart disease of native coronary artery without angina pectoris: Secondary | ICD-10-CM | POA: Diagnosis not present

## 2022-07-07 DIAGNOSIS — I10 Essential (primary) hypertension: Secondary | ICD-10-CM | POA: Diagnosis not present

## 2022-07-07 DIAGNOSIS — F03918 Unspecified dementia, unspecified severity, with other behavioral disturbance: Secondary | ICD-10-CM | POA: Diagnosis not present

## 2022-07-07 DIAGNOSIS — Z7902 Long term (current) use of antithrombotics/antiplatelets: Secondary | ICD-10-CM | POA: Diagnosis not present

## 2022-07-07 DIAGNOSIS — Z8673 Personal history of transient ischemic attack (TIA), and cerebral infarction without residual deficits: Secondary | ICD-10-CM | POA: Diagnosis not present

## 2022-07-07 DIAGNOSIS — E119 Type 2 diabetes mellitus without complications: Secondary | ICD-10-CM | POA: Diagnosis not present

## 2022-07-07 DIAGNOSIS — Z7984 Long term (current) use of oral hypoglycemic drugs: Secondary | ICD-10-CM | POA: Diagnosis not present

## 2022-07-07 DIAGNOSIS — R5381 Other malaise: Secondary | ICD-10-CM | POA: Diagnosis not present

## 2022-07-07 DIAGNOSIS — Z87440 Personal history of urinary (tract) infections: Secondary | ICD-10-CM | POA: Diagnosis not present

## 2022-07-07 DIAGNOSIS — E039 Hypothyroidism, unspecified: Secondary | ICD-10-CM | POA: Diagnosis not present

## 2022-07-07 DIAGNOSIS — E7800 Pure hypercholesterolemia, unspecified: Secondary | ICD-10-CM | POA: Diagnosis not present

## 2022-07-08 DIAGNOSIS — I2510 Atherosclerotic heart disease of native coronary artery without angina pectoris: Secondary | ICD-10-CM | POA: Diagnosis not present

## 2022-07-08 DIAGNOSIS — R5381 Other malaise: Secondary | ICD-10-CM | POA: Diagnosis not present

## 2022-07-08 DIAGNOSIS — Z7902 Long term (current) use of antithrombotics/antiplatelets: Secondary | ICD-10-CM | POA: Diagnosis not present

## 2022-07-08 DIAGNOSIS — E039 Hypothyroidism, unspecified: Secondary | ICD-10-CM | POA: Diagnosis not present

## 2022-07-08 DIAGNOSIS — E7800 Pure hypercholesterolemia, unspecified: Secondary | ICD-10-CM | POA: Diagnosis not present

## 2022-07-08 DIAGNOSIS — Z7984 Long term (current) use of oral hypoglycemic drugs: Secondary | ICD-10-CM | POA: Diagnosis not present

## 2022-07-08 DIAGNOSIS — F03918 Unspecified dementia, unspecified severity, with other behavioral disturbance: Secondary | ICD-10-CM | POA: Diagnosis not present

## 2022-07-08 DIAGNOSIS — I10 Essential (primary) hypertension: Secondary | ICD-10-CM | POA: Diagnosis not present

## 2022-07-08 DIAGNOSIS — E119 Type 2 diabetes mellitus without complications: Secondary | ICD-10-CM | POA: Diagnosis not present

## 2022-07-08 DIAGNOSIS — Z8673 Personal history of transient ischemic attack (TIA), and cerebral infarction without residual deficits: Secondary | ICD-10-CM | POA: Diagnosis not present

## 2022-07-08 DIAGNOSIS — Z87440 Personal history of urinary (tract) infections: Secondary | ICD-10-CM | POA: Diagnosis not present

## 2022-07-09 ENCOUNTER — Encounter: Payer: Self-pay | Admitting: Podiatry

## 2022-07-09 ENCOUNTER — Ambulatory Visit (INDEPENDENT_AMBULATORY_CARE_PROVIDER_SITE_OTHER): Payer: Medicare Other | Admitting: Podiatry

## 2022-07-09 VITALS — BP 139/101 | HR 74

## 2022-07-09 DIAGNOSIS — B351 Tinea unguium: Secondary | ICD-10-CM

## 2022-07-09 DIAGNOSIS — E1151 Type 2 diabetes mellitus with diabetic peripheral angiopathy without gangrene: Secondary | ICD-10-CM

## 2022-07-09 DIAGNOSIS — M79674 Pain in right toe(s): Secondary | ICD-10-CM | POA: Diagnosis not present

## 2022-07-09 DIAGNOSIS — L84 Corns and callosities: Secondary | ICD-10-CM | POA: Diagnosis not present

## 2022-07-09 DIAGNOSIS — M79675 Pain in left toe(s): Secondary | ICD-10-CM

## 2022-07-09 NOTE — Progress Notes (Signed)
  Subjective:  Patient ID: Dorothy Pena, female    DOB: Oct 26, 1935,  MRN: [id number]  Dorothy Pena presents to clinic today for at risk foot care. Pt has h/o NIDDM with PAD and painful thick toenails that are difficult to trim. Pain interferes with ambulation. Aggravating factors include wearing enclosed shoe gear. Pain is relieved with periodic professional debridement.  Chief Complaint  Patient presents with   Diabetes    Diabetic foot care, A1c- Unknown, Bg- not taking, PCP last seen a month ago    New problem(s): None.   PCP is Philmore Pali, NP (Inactive).  No Known Allergies  Review of Systems: Negative except as noted in the HPI.  Objective: No changes noted in today's physical examination. Vitals:   07/09/22 1134  BP: (!) 139/101  Pulse: 82   Dorothy Pena is a pleasant 87 y.o. female thin build in NAD. AAO x 3.  Vascular Examination: CFT <3 seconds b/l LE. Faintly palpable pedal pulses b/l LE. Pedal hair sparse b/l LE. Skin temperature gradient WNL b/l. No pain with calf compression b/l. No edema b/l LE. No cyanosis or clubbing noted b/l LE.  Dermatological Examination: Pedal skin thin, shiny and atrophic b/l LE.   No erythema, no edema, no drainage. No open wounds b/l LE. No interdigital macerations noted b/l LE.   Toenails 1-5 b/l elongated, discolored, dystrophic, thickened, crumbly with subungual debris and tenderness to dorsal palpation.  Hyperkeratotic lesion(s) submet head 4 right foot and submet head 5 left foot.  No erythema, no edema, no drainage, no fluctuance.  Musculoskeletal Examination: Muscle strength 5/5 to all lower extremity muscle groups bilaterally. HAV with bunion deformity noted b/l LE. Hammertoe deformity noted 2-5 b/l. Utilizes cane for ambulation assistance.  Neurological Examination: Pt has subjective symptoms of neuropathy. Protective sensation intact 5/5 intact bilaterally with 10g monofilament b/l. Vibratory sensation intact  b/l.  Assessment/Plan: 1. Pain due to onychomycosis of toenails of both feet   2. Callus of foot   3. Type II diabetes mellitus with peripheral circulatory disorder [facility])     -Patient was evaluated and treated. All patient's and/or POA's questions/concerns answered on today's visit. -Continue supportive shoe gear daily. -Toenails 1-5 b/l were debrided in length and girth with sterile nail nippers and dremel without iatrogenic bleeding.  -Callus(es) submet head 4 right foot and submet head 5 left foot pared utilizing rotary bur without complication or incident. Total number pared =2. -Patient/POA to call should there be question/concern in the interim.   Return in about 3 months (around 10/08/2022).  Marzetta Board, DPM

## 2022-07-14 DIAGNOSIS — Z8673 Personal history of transient ischemic attack (TIA), and cerebral infarction without residual deficits: Secondary | ICD-10-CM | POA: Diagnosis not present

## 2022-07-14 DIAGNOSIS — E7800 Pure hypercholesterolemia, unspecified: Secondary | ICD-10-CM | POA: Diagnosis not present

## 2022-07-14 DIAGNOSIS — I10 Essential (primary) hypertension: Secondary | ICD-10-CM | POA: Diagnosis not present

## 2022-07-14 DIAGNOSIS — F03918 Unspecified dementia, unspecified severity, with other behavioral disturbance: Secondary | ICD-10-CM | POA: Diagnosis not present

## 2022-07-14 DIAGNOSIS — E119 Type 2 diabetes mellitus without complications: Secondary | ICD-10-CM | POA: Diagnosis not present

## 2022-07-14 DIAGNOSIS — Z87440 Personal history of urinary (tract) infections: Secondary | ICD-10-CM | POA: Diagnosis not present

## 2022-07-14 DIAGNOSIS — Z7984 Long term (current) use of oral hypoglycemic drugs: Secondary | ICD-10-CM | POA: Diagnosis not present

## 2022-07-14 DIAGNOSIS — E039 Hypothyroidism, unspecified: Secondary | ICD-10-CM | POA: Diagnosis not present

## 2022-07-14 DIAGNOSIS — I2510 Atherosclerotic heart disease of native coronary artery without angina pectoris: Secondary | ICD-10-CM | POA: Diagnosis not present

## 2022-07-14 DIAGNOSIS — Z7902 Long term (current) use of antithrombotics/antiplatelets: Secondary | ICD-10-CM | POA: Diagnosis not present

## 2022-07-14 DIAGNOSIS — R5381 Other malaise: Secondary | ICD-10-CM | POA: Diagnosis not present

## 2022-07-22 DIAGNOSIS — I2510 Atherosclerotic heart disease of native coronary artery without angina pectoris: Secondary | ICD-10-CM | POA: Diagnosis not present

## 2022-07-22 DIAGNOSIS — I10 Essential (primary) hypertension: Secondary | ICD-10-CM | POA: Diagnosis not present

## 2022-07-22 DIAGNOSIS — F03918 Unspecified dementia, unspecified severity, with other behavioral disturbance: Secondary | ICD-10-CM | POA: Diagnosis not present

## 2022-07-22 DIAGNOSIS — Z87440 Personal history of urinary (tract) infections: Secondary | ICD-10-CM | POA: Diagnosis not present

## 2022-07-22 DIAGNOSIS — E039 Hypothyroidism, unspecified: Secondary | ICD-10-CM | POA: Diagnosis not present

## 2022-07-22 DIAGNOSIS — E119 Type 2 diabetes mellitus without complications: Secondary | ICD-10-CM | POA: Diagnosis not present

## 2022-07-22 DIAGNOSIS — R5381 Other malaise: Secondary | ICD-10-CM | POA: Diagnosis not present

## 2022-07-22 DIAGNOSIS — E7800 Pure hypercholesterolemia, unspecified: Secondary | ICD-10-CM | POA: Diagnosis not present

## 2022-07-22 DIAGNOSIS — Z7984 Long term (current) use of oral hypoglycemic drugs: Secondary | ICD-10-CM | POA: Diagnosis not present

## 2022-07-22 DIAGNOSIS — Z8673 Personal history of transient ischemic attack (TIA), and cerebral infarction without residual deficits: Secondary | ICD-10-CM | POA: Diagnosis not present

## 2022-07-22 DIAGNOSIS — Z7902 Long term (current) use of antithrombotics/antiplatelets: Secondary | ICD-10-CM | POA: Diagnosis not present

## 2022-08-06 DIAGNOSIS — Z7984 Long term (current) use of oral hypoglycemic drugs: Secondary | ICD-10-CM | POA: Diagnosis not present

## 2022-08-06 DIAGNOSIS — F03918 Unspecified dementia, unspecified severity, with other behavioral disturbance: Secondary | ICD-10-CM | POA: Diagnosis not present

## 2022-08-06 DIAGNOSIS — R5381 Other malaise: Secondary | ICD-10-CM | POA: Diagnosis not present

## 2022-08-06 DIAGNOSIS — I10 Essential (primary) hypertension: Secondary | ICD-10-CM | POA: Diagnosis not present

## 2022-08-06 DIAGNOSIS — E7800 Pure hypercholesterolemia, unspecified: Secondary | ICD-10-CM | POA: Diagnosis not present

## 2022-08-06 DIAGNOSIS — Z87440 Personal history of urinary (tract) infections: Secondary | ICD-10-CM | POA: Diagnosis not present

## 2022-08-06 DIAGNOSIS — Z8673 Personal history of transient ischemic attack (TIA), and cerebral infarction without residual deficits: Secondary | ICD-10-CM | POA: Diagnosis not present

## 2022-08-06 DIAGNOSIS — E039 Hypothyroidism, unspecified: Secondary | ICD-10-CM | POA: Diagnosis not present

## 2022-08-06 DIAGNOSIS — E119 Type 2 diabetes mellitus without complications: Secondary | ICD-10-CM | POA: Diagnosis not present

## 2022-08-06 DIAGNOSIS — Z7902 Long term (current) use of antithrombotics/antiplatelets: Secondary | ICD-10-CM | POA: Diagnosis not present

## 2022-08-06 DIAGNOSIS — I2510 Atherosclerotic heart disease of native coronary artery without angina pectoris: Secondary | ICD-10-CM | POA: Diagnosis not present

## 2022-08-07 DIAGNOSIS — I2510 Atherosclerotic heart disease of native coronary artery without angina pectoris: Secondary | ICD-10-CM | POA: Diagnosis not present

## 2022-08-07 DIAGNOSIS — E119 Type 2 diabetes mellitus without complications: Secondary | ICD-10-CM | POA: Diagnosis not present

## 2022-08-07 DIAGNOSIS — Z7902 Long term (current) use of antithrombotics/antiplatelets: Secondary | ICD-10-CM | POA: Diagnosis not present

## 2022-08-07 DIAGNOSIS — Z8673 Personal history of transient ischemic attack (TIA), and cerebral infarction without residual deficits: Secondary | ICD-10-CM | POA: Diagnosis not present

## 2022-08-07 DIAGNOSIS — R5381 Other malaise: Secondary | ICD-10-CM | POA: Diagnosis not present

## 2022-08-07 DIAGNOSIS — I10 Essential (primary) hypertension: Secondary | ICD-10-CM | POA: Diagnosis not present

## 2022-08-07 DIAGNOSIS — Z7984 Long term (current) use of oral hypoglycemic drugs: Secondary | ICD-10-CM | POA: Diagnosis not present

## 2022-08-07 DIAGNOSIS — E039 Hypothyroidism, unspecified: Secondary | ICD-10-CM | POA: Diagnosis not present

## 2022-08-07 DIAGNOSIS — Z87440 Personal history of urinary (tract) infections: Secondary | ICD-10-CM | POA: Diagnosis not present

## 2022-08-07 DIAGNOSIS — F03918 Unspecified dementia, unspecified severity, with other behavioral disturbance: Secondary | ICD-10-CM | POA: Diagnosis not present

## 2022-08-07 DIAGNOSIS — E7800 Pure hypercholesterolemia, unspecified: Secondary | ICD-10-CM | POA: Diagnosis not present

## 2022-08-13 DIAGNOSIS — E039 Hypothyroidism, unspecified: Secondary | ICD-10-CM | POA: Diagnosis not present

## 2022-08-13 DIAGNOSIS — E119 Type 2 diabetes mellitus without complications: Secondary | ICD-10-CM | POA: Diagnosis not present

## 2022-08-13 DIAGNOSIS — Z7902 Long term (current) use of antithrombotics/antiplatelets: Secondary | ICD-10-CM | POA: Diagnosis not present

## 2022-08-13 DIAGNOSIS — Z8673 Personal history of transient ischemic attack (TIA), and cerebral infarction without residual deficits: Secondary | ICD-10-CM | POA: Diagnosis not present

## 2022-08-13 DIAGNOSIS — R5381 Other malaise: Secondary | ICD-10-CM | POA: Diagnosis not present

## 2022-08-13 DIAGNOSIS — I2510 Atherosclerotic heart disease of native coronary artery without angina pectoris: Secondary | ICD-10-CM | POA: Diagnosis not present

## 2022-08-13 DIAGNOSIS — I10 Essential (primary) hypertension: Secondary | ICD-10-CM | POA: Diagnosis not present

## 2022-08-13 DIAGNOSIS — F03918 Unspecified dementia, unspecified severity, with other behavioral disturbance: Secondary | ICD-10-CM | POA: Diagnosis not present

## 2022-08-13 DIAGNOSIS — Z7984 Long term (current) use of oral hypoglycemic drugs: Secondary | ICD-10-CM | POA: Diagnosis not present

## 2022-08-13 DIAGNOSIS — E7800 Pure hypercholesterolemia, unspecified: Secondary | ICD-10-CM | POA: Diagnosis not present

## 2022-08-13 DIAGNOSIS — Z87440 Personal history of urinary (tract) infections: Secondary | ICD-10-CM | POA: Diagnosis not present

## 2022-08-18 DIAGNOSIS — Z87440 Personal history of urinary (tract) infections: Secondary | ICD-10-CM | POA: Diagnosis not present

## 2022-08-18 DIAGNOSIS — E7800 Pure hypercholesterolemia, unspecified: Secondary | ICD-10-CM | POA: Diagnosis not present

## 2022-08-18 DIAGNOSIS — R5381 Other malaise: Secondary | ICD-10-CM | POA: Diagnosis not present

## 2022-08-18 DIAGNOSIS — Z7902 Long term (current) use of antithrombotics/antiplatelets: Secondary | ICD-10-CM | POA: Diagnosis not present

## 2022-08-18 DIAGNOSIS — I10 Essential (primary) hypertension: Secondary | ICD-10-CM | POA: Diagnosis not present

## 2022-08-18 DIAGNOSIS — Z8673 Personal history of transient ischemic attack (TIA), and cerebral infarction without residual deficits: Secondary | ICD-10-CM | POA: Diagnosis not present

## 2022-08-18 DIAGNOSIS — F03918 Unspecified dementia, unspecified severity, with other behavioral disturbance: Secondary | ICD-10-CM | POA: Diagnosis not present

## 2022-08-18 DIAGNOSIS — E039 Hypothyroidism, unspecified: Secondary | ICD-10-CM | POA: Diagnosis not present

## 2022-08-18 DIAGNOSIS — E119 Type 2 diabetes mellitus without complications: Secondary | ICD-10-CM | POA: Diagnosis not present

## 2022-08-18 DIAGNOSIS — Z7984 Long term (current) use of oral hypoglycemic drugs: Secondary | ICD-10-CM | POA: Diagnosis not present

## 2022-08-18 DIAGNOSIS — I2510 Atherosclerotic heart disease of native coronary artery without angina pectoris: Secondary | ICD-10-CM | POA: Diagnosis not present

## 2022-08-20 DIAGNOSIS — E119 Type 2 diabetes mellitus without complications: Secondary | ICD-10-CM | POA: Diagnosis not present

## 2022-08-20 DIAGNOSIS — F03918 Unspecified dementia, unspecified severity, with other behavioral disturbance: Secondary | ICD-10-CM | POA: Diagnosis not present

## 2022-08-20 DIAGNOSIS — I10 Essential (primary) hypertension: Secondary | ICD-10-CM | POA: Diagnosis not present

## 2022-08-20 DIAGNOSIS — Z87440 Personal history of urinary (tract) infections: Secondary | ICD-10-CM | POA: Diagnosis not present

## 2022-08-20 DIAGNOSIS — R5381 Other malaise: Secondary | ICD-10-CM | POA: Diagnosis not present

## 2022-08-20 DIAGNOSIS — Z8673 Personal history of transient ischemic attack (TIA), and cerebral infarction without residual deficits: Secondary | ICD-10-CM | POA: Diagnosis not present

## 2022-08-20 DIAGNOSIS — Z7984 Long term (current) use of oral hypoglycemic drugs: Secondary | ICD-10-CM | POA: Diagnosis not present

## 2022-08-20 DIAGNOSIS — Z7902 Long term (current) use of antithrombotics/antiplatelets: Secondary | ICD-10-CM | POA: Diagnosis not present

## 2022-08-20 DIAGNOSIS — E039 Hypothyroidism, unspecified: Secondary | ICD-10-CM | POA: Diagnosis not present

## 2022-08-20 DIAGNOSIS — I2510 Atherosclerotic heart disease of native coronary artery without angina pectoris: Secondary | ICD-10-CM | POA: Diagnosis not present

## 2022-08-20 DIAGNOSIS — E7800 Pure hypercholesterolemia, unspecified: Secondary | ICD-10-CM | POA: Diagnosis not present

## 2022-08-25 DIAGNOSIS — E7800 Pure hypercholesterolemia, unspecified: Secondary | ICD-10-CM | POA: Diagnosis not present

## 2022-08-25 DIAGNOSIS — E039 Hypothyroidism, unspecified: Secondary | ICD-10-CM | POA: Diagnosis not present

## 2022-08-25 DIAGNOSIS — F03918 Unspecified dementia, unspecified severity, with other behavioral disturbance: Secondary | ICD-10-CM | POA: Diagnosis not present

## 2022-08-25 DIAGNOSIS — I2510 Atherosclerotic heart disease of native coronary artery without angina pectoris: Secondary | ICD-10-CM | POA: Diagnosis not present

## 2022-08-25 DIAGNOSIS — R5381 Other malaise: Secondary | ICD-10-CM | POA: Diagnosis not present

## 2022-08-25 DIAGNOSIS — Z7984 Long term (current) use of oral hypoglycemic drugs: Secondary | ICD-10-CM | POA: Diagnosis not present

## 2022-08-25 DIAGNOSIS — Z87440 Personal history of urinary (tract) infections: Secondary | ICD-10-CM | POA: Diagnosis not present

## 2022-08-25 DIAGNOSIS — Z7902 Long term (current) use of antithrombotics/antiplatelets: Secondary | ICD-10-CM | POA: Diagnosis not present

## 2022-08-25 DIAGNOSIS — E119 Type 2 diabetes mellitus without complications: Secondary | ICD-10-CM | POA: Diagnosis not present

## 2022-08-25 DIAGNOSIS — Z8673 Personal history of transient ischemic attack (TIA), and cerebral infarction without residual deficits: Secondary | ICD-10-CM | POA: Diagnosis not present

## 2022-08-25 DIAGNOSIS — I10 Essential (primary) hypertension: Secondary | ICD-10-CM | POA: Diagnosis not present

## 2022-09-01 DIAGNOSIS — F03918 Unspecified dementia, unspecified severity, with other behavioral disturbance: Secondary | ICD-10-CM | POA: Diagnosis not present

## 2022-09-01 DIAGNOSIS — R5381 Other malaise: Secondary | ICD-10-CM | POA: Diagnosis not present

## 2022-09-01 DIAGNOSIS — Z87440 Personal history of urinary (tract) infections: Secondary | ICD-10-CM | POA: Diagnosis not present

## 2022-09-01 DIAGNOSIS — Z8673 Personal history of transient ischemic attack (TIA), and cerebral infarction without residual deficits: Secondary | ICD-10-CM | POA: Diagnosis not present

## 2022-09-01 DIAGNOSIS — I5022 Chronic systolic (congestive) heart failure: Secondary | ICD-10-CM | POA: Diagnosis not present

## 2022-09-01 DIAGNOSIS — Z7902 Long term (current) use of antithrombotics/antiplatelets: Secondary | ICD-10-CM | POA: Diagnosis not present

## 2022-09-01 DIAGNOSIS — I10 Essential (primary) hypertension: Secondary | ICD-10-CM | POA: Diagnosis not present

## 2022-09-01 DIAGNOSIS — E7800 Pure hypercholesterolemia, unspecified: Secondary | ICD-10-CM | POA: Diagnosis not present

## 2022-09-01 DIAGNOSIS — E559 Vitamin D deficiency, unspecified: Secondary | ICD-10-CM | POA: Diagnosis not present

## 2022-09-01 DIAGNOSIS — E119 Type 2 diabetes mellitus without complications: Secondary | ICD-10-CM | POA: Diagnosis not present

## 2022-09-01 DIAGNOSIS — E039 Hypothyroidism, unspecified: Secondary | ICD-10-CM | POA: Diagnosis not present

## 2022-09-01 DIAGNOSIS — Z7984 Long term (current) use of oral hypoglycemic drugs: Secondary | ICD-10-CM | POA: Diagnosis not present

## 2022-09-01 DIAGNOSIS — E1149 Type 2 diabetes mellitus with other diabetic neurological complication: Secondary | ICD-10-CM | POA: Diagnosis not present

## 2022-09-01 DIAGNOSIS — I2510 Atherosclerotic heart disease of native coronary artery without angina pectoris: Secondary | ICD-10-CM | POA: Diagnosis not present

## 2022-09-03 DIAGNOSIS — Z7984 Long term (current) use of oral hypoglycemic drugs: Secondary | ICD-10-CM | POA: Diagnosis not present

## 2022-09-03 DIAGNOSIS — Z87440 Personal history of urinary (tract) infections: Secondary | ICD-10-CM | POA: Diagnosis not present

## 2022-09-03 DIAGNOSIS — Z8673 Personal history of transient ischemic attack (TIA), and cerebral infarction without residual deficits: Secondary | ICD-10-CM | POA: Diagnosis not present

## 2022-09-03 DIAGNOSIS — I2510 Atherosclerotic heart disease of native coronary artery without angina pectoris: Secondary | ICD-10-CM | POA: Diagnosis not present

## 2022-09-03 DIAGNOSIS — F03918 Unspecified dementia, unspecified severity, with other behavioral disturbance: Secondary | ICD-10-CM | POA: Diagnosis not present

## 2022-09-03 DIAGNOSIS — E7800 Pure hypercholesterolemia, unspecified: Secondary | ICD-10-CM | POA: Diagnosis not present

## 2022-09-03 DIAGNOSIS — I10 Essential (primary) hypertension: Secondary | ICD-10-CM | POA: Diagnosis not present

## 2022-09-03 DIAGNOSIS — Z7902 Long term (current) use of antithrombotics/antiplatelets: Secondary | ICD-10-CM | POA: Diagnosis not present

## 2022-09-03 DIAGNOSIS — R5381 Other malaise: Secondary | ICD-10-CM | POA: Diagnosis not present

## 2022-09-03 DIAGNOSIS — E039 Hypothyroidism, unspecified: Secondary | ICD-10-CM | POA: Diagnosis not present

## 2022-09-03 DIAGNOSIS — E119 Type 2 diabetes mellitus without complications: Secondary | ICD-10-CM | POA: Diagnosis not present

## 2022-09-08 DIAGNOSIS — R5381 Other malaise: Secondary | ICD-10-CM | POA: Diagnosis not present

## 2022-09-08 DIAGNOSIS — E039 Hypothyroidism, unspecified: Secondary | ICD-10-CM | POA: Diagnosis not present

## 2022-09-08 DIAGNOSIS — Z7984 Long term (current) use of oral hypoglycemic drugs: Secondary | ICD-10-CM | POA: Diagnosis not present

## 2022-09-08 DIAGNOSIS — F03918 Unspecified dementia, unspecified severity, with other behavioral disturbance: Secondary | ICD-10-CM | POA: Diagnosis not present

## 2022-09-08 DIAGNOSIS — Z87440 Personal history of urinary (tract) infections: Secondary | ICD-10-CM | POA: Diagnosis not present

## 2022-09-08 DIAGNOSIS — E119 Type 2 diabetes mellitus without complications: Secondary | ICD-10-CM | POA: Diagnosis not present

## 2022-09-08 DIAGNOSIS — I2510 Atherosclerotic heart disease of native coronary artery without angina pectoris: Secondary | ICD-10-CM | POA: Diagnosis not present

## 2022-09-08 DIAGNOSIS — Z8673 Personal history of transient ischemic attack (TIA), and cerebral infarction without residual deficits: Secondary | ICD-10-CM | POA: Diagnosis not present

## 2022-09-08 DIAGNOSIS — E7800 Pure hypercholesterolemia, unspecified: Secondary | ICD-10-CM | POA: Diagnosis not present

## 2022-09-08 DIAGNOSIS — I10 Essential (primary) hypertension: Secondary | ICD-10-CM | POA: Diagnosis not present

## 2022-09-08 DIAGNOSIS — Z7902 Long term (current) use of antithrombotics/antiplatelets: Secondary | ICD-10-CM | POA: Diagnosis not present

## 2022-09-09 ENCOUNTER — Encounter: Payer: Self-pay | Admitting: Podiatry

## 2022-09-09 ENCOUNTER — Ambulatory Visit: Payer: Medicare Other | Admitting: Podiatry

## 2022-09-09 DIAGNOSIS — B351 Tinea unguium: Secondary | ICD-10-CM

## 2022-09-09 DIAGNOSIS — M79674 Pain in right toe(s): Secondary | ICD-10-CM | POA: Diagnosis not present

## 2022-09-09 DIAGNOSIS — E1140 Type 2 diabetes mellitus with diabetic neuropathy, unspecified: Secondary | ICD-10-CM

## 2022-09-09 DIAGNOSIS — E1151 Type 2 diabetes mellitus with diabetic peripheral angiopathy without gangrene: Secondary | ICD-10-CM | POA: Diagnosis not present

## 2022-09-09 DIAGNOSIS — M79675 Pain in left toe(s): Secondary | ICD-10-CM

## 2022-09-09 DIAGNOSIS — L84 Corns and callosities: Secondary | ICD-10-CM

## 2022-09-09 NOTE — Progress Notes (Signed)
  Subjective:  Patient ID: Dorothy Pena, female    DOB: 1936/06/14,  MRN: [id number]  Dorothy Pena presents to clinic today for at risk foot care. Pt has h/o NIDDM with PAD and painful thick toenails that are difficult to trim. Pain interferes with ambulation. Aggravating factors include wearing enclosed shoe gear. Pain is relieved with periodic professional debridement.  No chief complaint on file.  New problem(s): None.   PCP is Philmore Pali, NP (Inactive).  No Known Allergies  Review of Systems: Negative except as noted in the HPI.  Objective: No changes noted in today's physical examination. There were no vitals filed for this visit.  Dorothy Pena is a pleasant 87 y.o. female thin build in NAD. AAO x 3.  Vascular Examination: CFT <3 seconds b/l LE. Faintly palpable pedal pulses b/l LE. Pedal hair sparse b/l LE. Skin temperature gradient WNL b/l. No pain with calf compression b/l. No edema b/l LE. No cyanosis or clubbing noted b/l LE.  Dermatological Examination: Pedal skin thin, shiny and atrophic b/l LE.   No erythema, no edema, no drainage. No open wounds b/l LE. No interdigital macerations noted b/l LE.   Toenails 1-5 b/l elongated, discolored, dystrophic, thickened, crumbly with subungual debris and tenderness to dorsal palpation.  Hyperkeratotic lesion(s) submet head 4 right foot and submet head 5 left foot.  No erythema, no edema, no drainage, no fluctuance.  Musculoskeletal Examination: Muscle strength 5/5 to all lower extremity muscle groups bilaterally. HAV with bunion deformity noted b/l LE. Hammertoe deformity noted 2-5 b/l. Utilizes cane for ambulation assistance.  Neurological Examination: Pt has subjective symptoms of neuropathy. Protective sensation intact 5/5 intact bilaterally with 10g monofilament b/l. Vibratory sensation intact b/l.  Assessment/Plan: 1. Pain due to onychomycosis of toenails of both feet   2. Type 2 diabetes mellitus with diabetic neuropathy,  without long-term current use of insulin (HCC)   3. Callus of foot   4. Type II diabetes mellitus with peripheral circulatory disorder [facility])     -Patient was evaluated and treated. All patient's and/or POA's questions/concerns answered on today's visit. -Continue supportive shoe gear daily. -Toenails 1-5 b/l were debrided in length and girth with sterile nail nippers and dremel without iatrogenic bleeding.  -Callus(es) submet head 4 right foot and submet head 5 left foot pared utilizing rotary bur without complication or incident. Total number pared =2. -Patient/POA to call should there be question/concern in the interim.   Return in about 3 months (around 12/10/2022) for rfc.  Lorenda Peck, DPM

## 2022-09-10 ENCOUNTER — Ambulatory Visit: Payer: Medicare Other | Admitting: Podiatry

## 2022-09-10 DIAGNOSIS — I2510 Atherosclerotic heart disease of native coronary artery without angina pectoris: Secondary | ICD-10-CM | POA: Diagnosis not present

## 2022-09-10 DIAGNOSIS — E039 Hypothyroidism, unspecified: Secondary | ICD-10-CM | POA: Diagnosis not present

## 2022-09-10 DIAGNOSIS — I10 Essential (primary) hypertension: Secondary | ICD-10-CM | POA: Diagnosis not present

## 2022-09-10 DIAGNOSIS — F03918 Unspecified dementia, unspecified severity, with other behavioral disturbance: Secondary | ICD-10-CM | POA: Diagnosis not present

## 2022-09-10 DIAGNOSIS — E7800 Pure hypercholesterolemia, unspecified: Secondary | ICD-10-CM | POA: Diagnosis not present

## 2022-09-10 DIAGNOSIS — E119 Type 2 diabetes mellitus without complications: Secondary | ICD-10-CM | POA: Diagnosis not present

## 2022-09-10 DIAGNOSIS — Z7902 Long term (current) use of antithrombotics/antiplatelets: Secondary | ICD-10-CM | POA: Diagnosis not present

## 2022-09-10 DIAGNOSIS — Z8673 Personal history of transient ischemic attack (TIA), and cerebral infarction without residual deficits: Secondary | ICD-10-CM | POA: Diagnosis not present

## 2022-09-10 DIAGNOSIS — Z7984 Long term (current) use of oral hypoglycemic drugs: Secondary | ICD-10-CM | POA: Diagnosis not present

## 2022-09-10 DIAGNOSIS — Z87440 Personal history of urinary (tract) infections: Secondary | ICD-10-CM | POA: Diagnosis not present

## 2022-09-10 DIAGNOSIS — R5381 Other malaise: Secondary | ICD-10-CM | POA: Diagnosis not present

## 2022-09-15 DIAGNOSIS — I10 Essential (primary) hypertension: Secondary | ICD-10-CM | POA: Diagnosis not present

## 2022-09-15 DIAGNOSIS — R5381 Other malaise: Secondary | ICD-10-CM | POA: Diagnosis not present

## 2022-09-15 DIAGNOSIS — I2510 Atherosclerotic heart disease of native coronary artery without angina pectoris: Secondary | ICD-10-CM | POA: Diagnosis not present

## 2022-09-15 DIAGNOSIS — Z7984 Long term (current) use of oral hypoglycemic drugs: Secondary | ICD-10-CM | POA: Diagnosis not present

## 2022-09-15 DIAGNOSIS — E039 Hypothyroidism, unspecified: Secondary | ICD-10-CM | POA: Diagnosis not present

## 2022-09-15 DIAGNOSIS — Z87440 Personal history of urinary (tract) infections: Secondary | ICD-10-CM | POA: Diagnosis not present

## 2022-09-15 DIAGNOSIS — Z7902 Long term (current) use of antithrombotics/antiplatelets: Secondary | ICD-10-CM | POA: Diagnosis not present

## 2022-09-15 DIAGNOSIS — E7800 Pure hypercholesterolemia, unspecified: Secondary | ICD-10-CM | POA: Diagnosis not present

## 2022-09-15 DIAGNOSIS — E119 Type 2 diabetes mellitus without complications: Secondary | ICD-10-CM | POA: Diagnosis not present

## 2022-09-15 DIAGNOSIS — F03918 Unspecified dementia, unspecified severity, with other behavioral disturbance: Secondary | ICD-10-CM | POA: Diagnosis not present

## 2022-09-15 DIAGNOSIS — Z8673 Personal history of transient ischemic attack (TIA), and cerebral infarction without residual deficits: Secondary | ICD-10-CM | POA: Diagnosis not present

## 2022-09-24 DIAGNOSIS — I2510 Atherosclerotic heart disease of native coronary artery without angina pectoris: Secondary | ICD-10-CM | POA: Diagnosis not present

## 2022-09-24 DIAGNOSIS — Z7984 Long term (current) use of oral hypoglycemic drugs: Secondary | ICD-10-CM | POA: Diagnosis not present

## 2022-09-24 DIAGNOSIS — E119 Type 2 diabetes mellitus without complications: Secondary | ICD-10-CM | POA: Diagnosis not present

## 2022-09-24 DIAGNOSIS — E039 Hypothyroidism, unspecified: Secondary | ICD-10-CM | POA: Diagnosis not present

## 2022-09-24 DIAGNOSIS — Z7902 Long term (current) use of antithrombotics/antiplatelets: Secondary | ICD-10-CM | POA: Diagnosis not present

## 2022-09-24 DIAGNOSIS — I10 Essential (primary) hypertension: Secondary | ICD-10-CM | POA: Diagnosis not present

## 2022-09-24 DIAGNOSIS — E7800 Pure hypercholesterolemia, unspecified: Secondary | ICD-10-CM | POA: Diagnosis not present

## 2022-09-24 DIAGNOSIS — Z8673 Personal history of transient ischemic attack (TIA), and cerebral infarction without residual deficits: Secondary | ICD-10-CM | POA: Diagnosis not present

## 2022-09-24 DIAGNOSIS — F03918 Unspecified dementia, unspecified severity, with other behavioral disturbance: Secondary | ICD-10-CM | POA: Diagnosis not present

## 2022-09-24 DIAGNOSIS — R5381 Other malaise: Secondary | ICD-10-CM | POA: Diagnosis not present

## 2022-09-24 DIAGNOSIS — Z87440 Personal history of urinary (tract) infections: Secondary | ICD-10-CM | POA: Diagnosis not present

## 2022-09-29 DIAGNOSIS — I2510 Atherosclerotic heart disease of native coronary artery without angina pectoris: Secondary | ICD-10-CM | POA: Diagnosis not present

## 2022-09-29 DIAGNOSIS — R5381 Other malaise: Secondary | ICD-10-CM | POA: Diagnosis not present

## 2022-09-29 DIAGNOSIS — Z8673 Personal history of transient ischemic attack (TIA), and cerebral infarction without residual deficits: Secondary | ICD-10-CM | POA: Diagnosis not present

## 2022-09-29 DIAGNOSIS — F03918 Unspecified dementia, unspecified severity, with other behavioral disturbance: Secondary | ICD-10-CM | POA: Diagnosis not present

## 2022-09-29 DIAGNOSIS — E039 Hypothyroidism, unspecified: Secondary | ICD-10-CM | POA: Diagnosis not present

## 2022-09-29 DIAGNOSIS — Z7902 Long term (current) use of antithrombotics/antiplatelets: Secondary | ICD-10-CM | POA: Diagnosis not present

## 2022-09-29 DIAGNOSIS — E7800 Pure hypercholesterolemia, unspecified: Secondary | ICD-10-CM | POA: Diagnosis not present

## 2022-09-29 DIAGNOSIS — E119 Type 2 diabetes mellitus without complications: Secondary | ICD-10-CM | POA: Diagnosis not present

## 2022-09-29 DIAGNOSIS — Z7984 Long term (current) use of oral hypoglycemic drugs: Secondary | ICD-10-CM | POA: Diagnosis not present

## 2022-09-29 DIAGNOSIS — Z87440 Personal history of urinary (tract) infections: Secondary | ICD-10-CM | POA: Diagnosis not present

## 2022-09-29 DIAGNOSIS — I10 Essential (primary) hypertension: Secondary | ICD-10-CM | POA: Diagnosis not present

## 2022-10-06 ENCOUNTER — Ambulatory Visit (INDEPENDENT_AMBULATORY_CARE_PROVIDER_SITE_OTHER): Payer: Medicare Other | Admitting: Physician Assistant

## 2022-10-06 ENCOUNTER — Encounter: Payer: Self-pay | Admitting: Physician Assistant

## 2022-10-06 VITALS — BP 166/71 | HR 82 | Resp 18 | Wt 126.0 lb

## 2022-10-06 DIAGNOSIS — G309 Alzheimer's disease, unspecified: Secondary | ICD-10-CM | POA: Diagnosis not present

## 2022-10-06 DIAGNOSIS — F0150 Vascular dementia without behavioral disturbance: Secondary | ICD-10-CM

## 2022-10-06 DIAGNOSIS — F0280 Dementia in other diseases classified elsewhere without behavioral disturbance: Secondary | ICD-10-CM

## 2022-10-06 MED ORDER — DONEPEZIL HCL 10 MG PO TABS: ORAL_TABLET | ORAL | 11 refills | Status: AC

## 2022-10-06 NOTE — Progress Notes (Signed)
Assessment/Plan:     Dementia likely due to Alzheimer's disease and vascular component, with behavioral disturbance  Dorothy Pena is a very pleasant 87 y.o. 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  She was on donepezil 10 mg daily but has not been on it for a while as the daughter thought that they had run out of that.  However, due to advanced disease, there is no indication to increase dose or to proceed adding any other antidementia medications, as the risk outweighed the benefits.  She still fairly independent of unclear activities of daily living especially with getting dressed that discussed with the family they need to increase social interaction for brain stimulation.  She is essentially homebound.      Follow up in 6 months. Continue donepezil 10 mg daily  Recommend adult day program in versus ALF as patient needs monitoring for safety and need for social interaction and activities  Continue to control mood as per PCP Recommend good control of cardiovascular risk factors    Subjective:    This patient is accompanied in the office by her son and daughter who supplements the history.  Previous records as well as any outside records available were reviewed prior to todays visit. Patient was last seen on 04/03/2022    Any changes in memory since last visit?   Endorsed,  "STM is worse, sometimes she may talk about me and doesn't realize she is there"  She still enjoys going to the [employer] [place] store to shop. repeats oneself?  Endorsed, "worse, asks frequently about the dates " Disoriented when walking into a room?  "Sometimes she does not know where she lives "  leaving objects in unusual places?   Endorsed. She may not know where the money has been placed  Wandering behavior?  She may open the door  to strangers , but not  leaving the house Any personality changes since last visit?  denies   Any worsening depression?:  denies   Hallucinations or paranoia?  Frequently she sees her granddaughter as a baby (she is 46), sometimes she ask about her father not knowing he is dead.  She may be paranoid about money, asking frequently "who is getting my checks, because getting my money". Seizures?    denies    Any sleep changes?  Endorsed vivid dreams with full conversations, no other REM behavior or sleepwalking   Sleep apnea?   denies   Any hygiene concerns?  She continues to refuse taking showers or changing her clothes creating significant frustration admission with her daughter Independent of bathing and dressing?  Endorsed  Does the patient needs help with medications?  Family is in charge Who is in charge of the finances?  Family is in charge Any changes in appetite?  denies patient's son reports that she does not drink enough water.  Sometimes she forgets that she ate and asks "where is my food?  " Patient have trouble swallowing?  denies   Does the patient cook?  Any kitchen accidents such as leaving the stove on? Patient denies   Any headaches?   denies   Chronic back pain  denies   Ambulates with difficulty?  Uses a cane for stability . She does not like the walker  Recent falls or head injuries? denies     Unilateral weakness, numbness or tingling?  denies   Any tremors?  denies   Any anosmia?  Patient denies   Any incontinence of urine?  Endorsed.  She wears diapers Any bowel dysfunction?  denies      Patient lives  with her daughter  Does the patient drive?  She no longer drives   PREVIOUS MEDICATIONS:   CURRENT MEDICATIONS:  Outpatient Encounter Medications as of 10/06/2022  Medication Sig   carvedilol (COREG) 25 MG tablet Take 25 mg by mouth 2 (two) times daily with a meal.   cetirizine (ZYRTEC) 10 MG tablet Take 20 mg by mouth daily.   clopidogrel (PLAVIX) 75 MG tablet Take 1 tablet by mouth  daily.   digoxin (LANOXIN) 0.125 MG tablet Take 0.125 mg by mouth daily.   furosemide (LASIX) 20 MG tablet Take by mouth.   glimepiride (AMARYL) 4 MG tablet Take 4 mg by mouth 2 (two) times daily.   ketorolac (ACULAR) 0.5 % ophthalmic solution Place 1 drop into the left eye 4 (four) times daily.   levothyroxine (SYNTHROID) 50 MCG tablet Take 50 mcg by mouth daily.   levothyroxine (SYNTHROID) 75 MCG tablet Take 75 mcg by mouth daily.   lisinopril (ZESTRIL) 10 MG tablet Take 0.5 tablets by mouth daily.   megestrol (MEGACE) 40 MG/ML suspension Take 200 mg by mouth daily.   metFORMIN (GLUCOPHAGE) 500 MG tablet Take 500 mg by mouth 2 (two) times daily.   mupirocin ointment (BACTROBAN) 2 % Apply 1 application topically daily.   nitroGLYCERIN (NITROSTAT) 0.4 MG SL tablet Place under the tongue.   ofloxacin (OCUFLOX) 0.3 % ophthalmic solution Place 1 drop into the left eye 4 (four) times daily.   prednisoLONE acetate (PRED FORTE) 1 % ophthalmic suspension Place 1 drop into the left eye 4 (four) times daily.   ranolazine (RANEXA) 500 MG 12 hr tablet Take 1 tablet by mouth 2 (two) times daily.   rosuvastatin (CRESTOR) 20 MG tablet Take 1 tablet (20 mg total) by mouth daily.   traMADol (ULTRAM) 50 MG tablet Take 50 mg by mouth every 4 (four) hours as needed.   triamcinolone cream (KENALOG) 0.5 % Apply topically 2 (two) times daily.   Vitamin D, Ergocalciferol, (DRISDOL) 50000 UNITS CAPS capsule Take 50,000 Units by mouth every 7 (seven) days. On Sunday   [DISCONTINUED] donepezil (ARICEPT) 10 MG tablet Take half tablet (5 mg) daily for 2 weeks, then increase to the full tablet at 10 mg daily   donepezil (ARICEPT) 10 MG tablet Take one tablet at 10 mg daily   No facility-administered encounter medications on file as of 10/06/2022.       03/12/2021    3:00 PM  MMSE - Mini Mental State Exam  Orientation to time 2  Orientation to Place 3  Registration 3  Attention/ Calculation 3  Recall 0  Language-  name 2 objects 2  Language- repeat 1  Language- follow 3 step command 3  Language- read & follow direction 1  Write a sentence 1  Copy design 0  Total score 19       No data to display          Objective:     PHYSICAL EXAMINATION:    VITALS:   Vitals:   10/06/22 1135  BP: (!) 166/71  Pulse: 82  Resp: 18  SpO2: 95%  Weight: 126 lb (57.2 kg)    GEN:  The patient appears stated age and is in NAD. HEENT:  Normocephalic, atraumatic.   Neurological examination:  General: NAD, well-groomed, appears stated age. Orientation: The patient is alert. Oriented to person, not to place and date Cranial nerves: There is good facial symmetry.The speech is fluent and clear. No aphasia or dysarthria. Fund of knowledge is reduced. Recent and remote memory are impaired. Attention and concentration are reduced.  Able to name objects and repeat phrases.  Hearing is intact to conversational tone.   Sensation: Sensation is intact to light touch throughout Motor: Strength is at least antigravity x4. DTR's 2/4 in UE/LE     Movement examination: Tone: There is normal tone in the UE/LE Abnormal movements:  no tremor.  No myoclonus.  No asterixis.   Coordination:  There is no decremation with RAM's. Normal finger to nose  Gait and Station: The patient has no difficulty arising out of a deep-seated chair without the use of the hands. The patient's stride length is good.  Gait is cautious and narrow.    Thank you for allowing [place] the opportunity to participate in the care of this nice patient. Please do not hesitate to contact us for any questions or concerns.   Total time spent on today's visit was 30 minutes dedicated to this patient today, preparing to see patient, examining the patient, ordering tests and/or medications and counseling the patient, documenting clinical information in the EHR or other health record, independently interpreting results and communicating results to the  patient/family, discussing treatment and goals, answering patient's questions and coordinating care.  Cc:  Ronal Fear, NP (Inactive)  Marlowe Kays 10/06/2022 2:59 PM

## 2022-10-06 NOTE — Patient Instructions (Signed)
It was a pleasure to see you today at our office.   Recommendations:  Follow up in  6 months Continue donepezil 10 mg daily  Consider caregiver support group    Whom to call:  Memory  decline, memory medications: Call our office [phone]   For psychiatric meds, mood meds: Please have your primary care physician manage these medications.    For guidance in geriatric dementia issues please call [facility] [phone]  If you have any severe symptoms of a stroke, or other severe issues such as confusion,severe chills or fever, etc call 911 or go to the ER as you may need to be evaluated further    RECOMMENDATIONS FOR ALL PATIENTS WITH MEMORY PROBLEMS: 1. Continue to exercise (Recommend 30 minutes of walking everyday, or 3 hours every week) 2. Increase social interactions - continue going to [place] and enjoy social gatherings with friends and family 3. Eat healthy, avoid fried foods and eat more fruits and vegetables 4. Maintain adequate blood pressure, blood sugar, and blood cholesterol level. Reducing the risk of stroke and cardiovascular disease also helps promoting better memory. 5. Avoid stressful situations. Live a simple life and avoid aggravations. Organize your time and prepare for the next day in anticipation. 6. Sleep well, avoid any interruptions of sleep and avoid any distractions in the bedroom that may interfere with adequate sleep quality 7. Avoid sugar, avoid sweets as there is a strong link between excessive sugar intake, diabetes, and cognitive impairment We discussed the Mediterranean diet, which has been shown to help patients reduce the risk of progressive memory disorders and reduces cardiovascular risk. This includes eating fish, eat fruits and green leafy vegetables, nuts like almonds and hazelnuts, walnuts, and also use olive oil. Avoid fast foods and fried foods as much as possible. Avoid sweets and sugar as sugar use has been linked to worsening  of memory function.  There is always a concern of gradual progression of memory problems. If this is the case, then we may need to adjust level of care according to patient needs. Support, both to the patient and caregiver, should then be put into place.    FALL PRECAUTIONS: Be cautious when walking. Scan the area for obstacles that may increase the risk of trips and falls. When getting up in the mornings, sit up at the edge of the bed for a few minutes before getting out of bed. Consider elevating the bed at the head end to avoid drop of blood pressure when getting up. Walk always in a well-lit room (use night lights in the walls). Avoid area rugs or power cords from appliances in the middle of the walkways. Use a walker or a cane if necessary and consider physical therapy for balance exercise. Get your eyesight checked regularly.  FINANCIAL OVERSIGHT: Supervision, especially oversight when making financial decisions or transactions is also recommended.  HOME SAFETY: Consider the safety of the kitchen when operating appliances like stoves, microwave oven, and blender. Consider having supervision and share cooking responsibilities until no longer able to participate in those. Accidents with firearms and other hazards in the house should be identified and addressed as well.   ABILITY TO BE LEFT ALONE: If patient is unable to contact 911 operator, consider using [facility], or when the need is there, arrange for someone to stay with patients. Smoking is a fire hazard, consider supervision or cessation. Risk of wandering should be assessed by caregiver and if detected at any point, supervision and safe proof recommendations  should be instituted.  MEDICATION SUPERVISION: Inability to self-administer medication needs to be constantly addressed. Implement a mechanism to ensure safe administration of the medications.   DRIVING: Regarding driving, in patients with progressive memory problems, driving will be  impaired. We advise to have someone else do the driving if trouble finding directions or if minor accidents are reported. Independent driving assessment is available to determine safety of driving.   If you are interested in the driving assessment, you can contact the following:  [employer] [facility] in [place] [phone]  [facility] [phone]  [facility] [phone]  [facility] [phone] or [phone]

## 2022-12-11 ENCOUNTER — Ambulatory Visit: Payer: Medicare Other | Admitting: Podiatry

## 2022-12-11 DIAGNOSIS — I2510 Atherosclerotic heart disease of native coronary artery without angina pectoris: Secondary | ICD-10-CM | POA: Diagnosis not present

## 2022-12-11 DIAGNOSIS — E785 Hyperlipidemia, unspecified: Secondary | ICD-10-CM | POA: Diagnosis not present

## 2022-12-11 DIAGNOSIS — I110 Hypertensive heart disease with heart failure: Secondary | ICD-10-CM | POA: Diagnosis not present

## 2022-12-11 DIAGNOSIS — I340 Nonrheumatic mitral (valve) insufficiency: Secondary | ICD-10-CM | POA: Diagnosis not present

## 2022-12-11 DIAGNOSIS — Z79899 Other long term (current) drug therapy: Secondary | ICD-10-CM | POA: Diagnosis not present

## 2022-12-11 DIAGNOSIS — I5022 Chronic systolic (congestive) heart failure: Secondary | ICD-10-CM | POA: Diagnosis not present

## 2022-12-23 ENCOUNTER — Ambulatory Visit: Payer: Medicare Other | Admitting: Podiatry

## 2022-12-23 DIAGNOSIS — B351 Tinea unguium: Secondary | ICD-10-CM | POA: Diagnosis not present

## 2022-12-23 DIAGNOSIS — M79675 Pain in left toe(s): Secondary | ICD-10-CM

## 2022-12-23 DIAGNOSIS — M79674 Pain in right toe(s): Secondary | ICD-10-CM

## 2022-12-23 NOTE — Progress Notes (Signed)
    Subjective:  Patient ID: Dorothy Pena, female    DOB: [birth year],  MRN: [id number]   Dorothy Pena presents to clinic today for:  Chief Complaint  Patient presents with   Nail Problem    Diabetic Foot Care- Nail trim   . Patient notes nails are thick, discolored, elongated and painful in shoegear when trying to ambulate.  She uses a cane for assistance with ambulation  PCP is Lam, Tawny Asal, NP (Inactive).  No Known Allergies  Review of Systems: Negative except as noted in the HPI.  Objective:  There were no vitals filed for this visit.  Dorothy Pena is a pleasant 87 y.o. female in NAD. AAO x 3.  Vascular Examination: Capillary refill time is 3-5 seconds to toes bilateral. Palpable pedal pulses b/l LE. Digital hair present b/l. No pedal edema b/l. Skin temperature gradient WNL b/l. No varicosities b/l. No cyanosis or clubbing noted b/l.   Dermatological Examination: Pedal skin with normal turgor, texture and tone b/l. No open wounds. No interdigital macerations b/l. Toenails x10 are 3mm thick, discolored, dystrophic with subungual debris. There is pain with compression of the nail plates.  They are elongated x10  Assessment/Plan: 1. Pain due to onychomycosis of toenails of both feet    The mycotic toenails were sharply debrided x10 with sterile nail nippers and a power debriding burr to decrease bulk/thickness and length.    Recommend follow-up in 3 to 4 months for RFC  Dorothy Passmore DBurna Pena, DPM, FACFAS [facility]     [address] [place], [place] [address]                Office [phone]  Fax [phone]

## 2023-01-07 DIAGNOSIS — Z1152 Encounter for screening for COVID-19: Secondary | ICD-10-CM | POA: Diagnosis not present

## 2023-01-07 DIAGNOSIS — E7800 Pure hypercholesterolemia, unspecified: Secondary | ICD-10-CM | POA: Diagnosis not present

## 2023-01-07 DIAGNOSIS — E119 Type 2 diabetes mellitus without complications: Secondary | ICD-10-CM | POA: Diagnosis not present

## 2023-01-07 DIAGNOSIS — I2510 Atherosclerotic heart disease of native coronary artery without angina pectoris: Secondary | ICD-10-CM | POA: Diagnosis not present

## 2023-01-07 DIAGNOSIS — R5381 Other malaise: Secondary | ICD-10-CM | POA: Diagnosis not present

## 2023-01-07 DIAGNOSIS — I5022 Chronic systolic (congestive) heart failure: Secondary | ICD-10-CM | POA: Diagnosis not present

## 2023-01-07 DIAGNOSIS — J449 Chronic obstructive pulmonary disease, unspecified: Secondary | ICD-10-CM | POA: Diagnosis not present

## 2023-01-07 DIAGNOSIS — R5383 Other fatigue: Secondary | ICD-10-CM | POA: Diagnosis not present

## 2023-01-07 DIAGNOSIS — I110 Hypertensive heart disease with heart failure: Secondary | ICD-10-CM | POA: Diagnosis not present

## 2023-01-07 DIAGNOSIS — R079 Chest pain, unspecified: Secondary | ICD-10-CM | POA: Diagnosis not present

## 2023-01-07 DIAGNOSIS — Z7984 Long term (current) use of oral hypoglycemic drugs: Secondary | ICD-10-CM | POA: Diagnosis not present

## 2023-01-07 DIAGNOSIS — R531 Weakness: Secondary | ICD-10-CM | POA: Diagnosis not present

## 2023-01-07 DIAGNOSIS — E079 Disorder of thyroid, unspecified: Secondary | ICD-10-CM | POA: Diagnosis not present

## 2023-01-16 DIAGNOSIS — R079 Chest pain, unspecified: Secondary | ICD-10-CM | POA: Diagnosis not present

## 2023-01-30 DIAGNOSIS — E119 Type 2 diabetes mellitus without complications: Secondary | ICD-10-CM | POA: Diagnosis not present

## 2023-01-30 DIAGNOSIS — N390 Urinary tract infection, site not specified: Secondary | ICD-10-CM | POA: Diagnosis not present

## 2023-01-30 DIAGNOSIS — I252 Old myocardial infarction: Secondary | ICD-10-CM | POA: Diagnosis not present

## 2023-01-30 DIAGNOSIS — E7800 Pure hypercholesterolemia, unspecified: Secondary | ICD-10-CM | POA: Diagnosis not present

## 2023-01-30 DIAGNOSIS — Z7984 Long term (current) use of oral hypoglycemic drugs: Secondary | ICD-10-CM | POA: Diagnosis not present

## 2023-01-30 DIAGNOSIS — I2510 Atherosclerotic heart disease of native coronary artery without angina pectoris: Secondary | ICD-10-CM | POA: Diagnosis not present

## 2023-01-30 DIAGNOSIS — Z79899 Other long term (current) drug therapy: Secondary | ICD-10-CM | POA: Diagnosis not present

## 2023-01-30 DIAGNOSIS — R0602 Shortness of breath: Secondary | ICD-10-CM | POA: Diagnosis not present

## 2023-01-30 DIAGNOSIS — E039 Hypothyroidism, unspecified: Secondary | ICD-10-CM | POA: Diagnosis not present

## 2023-01-30 DIAGNOSIS — R079 Chest pain, unspecified: Secondary | ICD-10-CM | POA: Diagnosis not present

## 2023-02-05 DIAGNOSIS — E1149 Type 2 diabetes mellitus with other diabetic neurological complication: Secondary | ICD-10-CM | POA: Diagnosis not present

## 2023-02-05 DIAGNOSIS — I5022 Chronic systolic (congestive) heart failure: Secondary | ICD-10-CM | POA: Diagnosis not present

## 2023-02-07 DIAGNOSIS — N390 Urinary tract infection, site not specified: Secondary | ICD-10-CM | POA: Diagnosis not present

## 2023-03-04 DIAGNOSIS — Z139 Encounter for screening, unspecified: Secondary | ICD-10-CM | POA: Diagnosis not present

## 2023-03-04 DIAGNOSIS — Z9181 History of falling: Secondary | ICD-10-CM | POA: Diagnosis not present

## 2023-03-04 DIAGNOSIS — Z0000 Encounter for general adult medical examination without abnormal findings: Secondary | ICD-10-CM | POA: Diagnosis not present

## 2023-03-24 ENCOUNTER — Encounter: Payer: Medicare Other | Admitting: Podiatry

## 2023-03-24 DIAGNOSIS — I2510 Atherosclerotic heart disease of native coronary artery without angina pectoris: Secondary | ICD-10-CM | POA: Diagnosis not present

## 2023-03-24 DIAGNOSIS — I10 Essential (primary) hypertension: Secondary | ICD-10-CM | POA: Diagnosis not present

## 2023-03-24 DIAGNOSIS — E119 Type 2 diabetes mellitus without complications: Secondary | ICD-10-CM | POA: Diagnosis not present

## 2023-03-24 DIAGNOSIS — I509 Heart failure, unspecified: Secondary | ICD-10-CM | POA: Diagnosis not present

## 2023-03-24 DIAGNOSIS — I6523 Occlusion and stenosis of bilateral carotid arteries: Secondary | ICD-10-CM | POA: Diagnosis not present

## 2023-03-24 DIAGNOSIS — E785 Hyperlipidemia, unspecified: Secondary | ICD-10-CM | POA: Diagnosis not present

## 2023-03-26 NOTE — Progress Notes (Signed)
Patient was a "no show" for her scheduled appointment

## 2023-04-08 ENCOUNTER — Ambulatory Visit: Payer: Medicare Other | Admitting: Physician Assistant

## 2023-04-21 DIAGNOSIS — I080 Rheumatic disorders of both mitral and aortic valves: Secondary | ICD-10-CM | POA: Diagnosis not present

## 2023-05-06 DIAGNOSIS — I2510 Atherosclerotic heart disease of native coronary artery without angina pectoris: Secondary | ICD-10-CM | POA: Diagnosis not present

## 2023-05-06 DIAGNOSIS — I10 Essential (primary) hypertension: Secondary | ICD-10-CM | POA: Diagnosis not present

## 2023-05-25 DIAGNOSIS — I10 Essential (primary) hypertension: Secondary | ICD-10-CM | POA: Diagnosis not present

## 2023-05-25 DIAGNOSIS — I5022 Chronic systolic (congestive) heart failure: Secondary | ICD-10-CM | POA: Diagnosis not present

## 2023-05-25 DIAGNOSIS — E1149 Type 2 diabetes mellitus with other diabetic neurological complication: Secondary | ICD-10-CM | POA: Diagnosis not present

## 2023-05-25 DIAGNOSIS — E559 Vitamin D deficiency, unspecified: Secondary | ICD-10-CM | POA: Diagnosis not present

## 2023-05-25 DIAGNOSIS — E7800 Pure hypercholesterolemia, unspecified: Secondary | ICD-10-CM | POA: Diagnosis not present

## 2023-05-25 DIAGNOSIS — E039 Hypothyroidism, unspecified: Secondary | ICD-10-CM | POA: Diagnosis not present

## 2023-05-25 DIAGNOSIS — Z79899 Other long term (current) drug therapy: Secondary | ICD-10-CM | POA: Diagnosis not present

## 2023-09-27 DIAGNOSIS — E1149 Type 2 diabetes mellitus with other diabetic neurological complication: Secondary | ICD-10-CM | POA: Diagnosis not present

## 2023-09-27 DIAGNOSIS — Z23 Encounter for immunization: Secondary | ICD-10-CM | POA: Diagnosis not present

## 2023-09-27 DIAGNOSIS — E7800 Pure hypercholesterolemia, unspecified: Secondary | ICD-10-CM | POA: Diagnosis not present

## 2023-09-27 DIAGNOSIS — I10 Essential (primary) hypertension: Secondary | ICD-10-CM | POA: Diagnosis not present

## 2023-09-27 DIAGNOSIS — E559 Vitamin D deficiency, unspecified: Secondary | ICD-10-CM | POA: Diagnosis not present

## 2023-09-27 DIAGNOSIS — E039 Hypothyroidism, unspecified: Secondary | ICD-10-CM | POA: Diagnosis not present

## 2023-09-27 DIAGNOSIS — I5022 Chronic systolic (congestive) heart failure: Secondary | ICD-10-CM | POA: Diagnosis not present

## 2023-10-14 ENCOUNTER — Other Ambulatory Visit: Payer: Self-pay | Admitting: Physician Assistant

## 2023-12-03 ENCOUNTER — Ambulatory Visit: Admitting: Podiatry

## 2023-12-03 DIAGNOSIS — B351 Tinea unguium: Secondary | ICD-10-CM

## 2023-12-03 DIAGNOSIS — M79674 Pain in right toe(s): Secondary | ICD-10-CM | POA: Diagnosis not present

## 2023-12-03 DIAGNOSIS — M79675 Pain in left toe(s): Secondary | ICD-10-CM | POA: Diagnosis not present

## 2023-12-03 NOTE — Progress Notes (Unsigned)
    Subjective:  Patient ID: Dorothy Pena, female    DOB: March 03, 1936,  MRN: [id number]  Rifky Lapre presents to clinic today for:  Chief Complaint  Patient presents with   [facility]    [facility] with out callous. A1c is unknown. Takes Plavix .    Patient notes nails are thick, discolored, elongated and painful in shoegear when trying to ambulate.  She does need assistance with ambulation.  Past Medical History:  Diagnosis Date   Diabetes mellitus without complication (HCC)    Hypertension    Intermediate coronary syndrome (HCC)    MI (myocardial infarction) (HCC) 2009   1 stent   Past Surgical History:  Procedure Laterality Date   CAROTID STENT INSERTION     FRACTIONAL FLOW RESERVE WIRE  12/12/2013   Procedure: FRACTIONAL FLOW RESERVE WIRE;  Surgeon: Lucendia Rusk, MD;  Location: [facility] CATH LAB;  Service: Cardiovascular;;   LEFT HEART CATHETERIZATION WITH CORONARY ANGIOGRAM N/A 12/12/2013   Procedure: LEFT HEART CATHETERIZATION WITH CORONARY ANGIOGRAM;  Surgeon: Lucendia Rusk, MD;  Location: [facility] CATH LAB;  Service: Cardiovascular;  Laterality: N/A;   No Known Allergies  Review of Systems: Negative except as noted in the HPI.  Objective:  Dorothy Pena is a pleasant 88 y.o. female in NAD. AAO x 3.  Vascular Examination: Capillary refill time is 3-5 seconds to toes bilateral. Palpable pedal pulses b/l LE. Digital hair present b/l.  Skin temperature gradient WNL b/l. No varicosities b/l. No cyanosis noted b/l.   Dermatological Examination: Pedal skin with normal turgor, texture and tone b/l. No open wounds. No interdigital macerations b/l. Toenails x10 are 3mm thick, discolored, dystrophic with subungual debris. There is pain with compression of the nail plates.  They are elongated x10  Assessment/Plan: 1. Pain due to onychomycosis of toenails of both feet     The mycotic toenails were sharply debrided x10 with sterile nail nippers and a power debriding burr to decrease bulk/thickness  and length.    Return in about 3 months (around 03/04/2024) for [facility].   Joe Murders, DPM, FACFAS [facility]     [address] [place], [place] [address]                Office [phone]  Fax [phone]

## 2023-12-09 DIAGNOSIS — W19XXXA Unspecified fall, initial encounter: Secondary | ICD-10-CM | POA: Diagnosis not present

## 2023-12-09 DIAGNOSIS — S0081XA Abrasion of other part of head, initial encounter: Secondary | ICD-10-CM | POA: Diagnosis not present

## 2023-12-09 DIAGNOSIS — I10 Essential (primary) hypertension: Secondary | ICD-10-CM | POA: Diagnosis not present

## 2023-12-09 DIAGNOSIS — M25462 Effusion, left knee: Secondary | ICD-10-CM | POA: Diagnosis not present

## 2024-01-27 DIAGNOSIS — S199XXA Unspecified injury of neck, initial encounter: Secondary | ICD-10-CM | POA: Diagnosis not present

## 2024-01-27 DIAGNOSIS — I499 Cardiac arrhythmia, unspecified: Secondary | ICD-10-CM | POA: Diagnosis not present

## 2024-01-27 DIAGNOSIS — Z711 Person with feared health complaint in whom no diagnosis is made: Secondary | ICD-10-CM | POA: Diagnosis not present

## 2024-01-27 DIAGNOSIS — T50904A Poisoning by unspecified drugs, medicaments and biological substances, undetermined, initial encounter: Secondary | ICD-10-CM | POA: Diagnosis not present

## 2024-01-27 DIAGNOSIS — S0990XA Unspecified injury of head, initial encounter: Secondary | ICD-10-CM | POA: Diagnosis not present

## 2024-01-27 DIAGNOSIS — T887XXA Unspecified adverse effect of drug or medicament, initial encounter: Secondary | ICD-10-CM | POA: Diagnosis not present

## 2024-02-02 DIAGNOSIS — Z23 Encounter for immunization: Secondary | ICD-10-CM | POA: Diagnosis not present

## 2024-02-02 DIAGNOSIS — I10 Essential (primary) hypertension: Secondary | ICD-10-CM | POA: Diagnosis not present

## 2024-02-02 DIAGNOSIS — E559 Vitamin D deficiency, unspecified: Secondary | ICD-10-CM | POA: Diagnosis not present

## 2024-02-02 DIAGNOSIS — E039 Hypothyroidism, unspecified: Secondary | ICD-10-CM | POA: Diagnosis not present

## 2024-02-02 DIAGNOSIS — E7800 Pure hypercholesterolemia, unspecified: Secondary | ICD-10-CM | POA: Diagnosis not present

## 2024-02-02 DIAGNOSIS — I5022 Chronic systolic (congestive) heart failure: Secondary | ICD-10-CM | POA: Diagnosis not present

## 2024-02-02 DIAGNOSIS — E1140 Type 2 diabetes mellitus with diabetic neuropathy, unspecified: Secondary | ICD-10-CM | POA: Diagnosis not present

## 2024-03-01 ENCOUNTER — Ambulatory Visit: Admitting: Podiatry

## 2024-03-01 DIAGNOSIS — M79675 Pain in left toe(s): Secondary | ICD-10-CM | POA: Diagnosis not present

## 2024-03-01 DIAGNOSIS — B351 Tinea unguium: Secondary | ICD-10-CM

## 2024-03-01 DIAGNOSIS — M79674 Pain in right toe(s): Secondary | ICD-10-CM

## 2024-03-01 NOTE — Progress Notes (Signed)
    Subjective:  Patient ID: Dorothy Pena, female    DOB: 1936/04/14,  MRN: [id number]  Dorothy Pena presents to clinic today for:  Chief Complaint  Patient presents with   [facility]    [facility] , no callous A1c is unknown,  Plavix    Patient notes nails are thick, discolored, elongated and painful in shoegear when trying to ambulate.  She does need assistance with ambulation.  She is using a walker today  Past Medical History:  Diagnosis Date   Diabetes mellitus without complication (HCC)    Hypertension    Intermediate coronary syndrome (HCC)    MI (myocardial infarction) (HCC) 2009   1 stent   Past Surgical History:  Procedure Laterality Date   CAROTID STENT INSERTION     FRACTIONAL FLOW RESERVE WIRE  12/12/2013   Procedure: FRACTIONAL FLOW RESERVE WIRE;  Surgeon: Candyce GORMAN Reek, MD;  Location: [facility] CATH LAB;  Service: Cardiovascular;;   LEFT HEART CATHETERIZATION WITH CORONARY ANGIOGRAM N/A 12/12/2013   Procedure: LEFT HEART CATHETERIZATION WITH CORONARY ANGIOGRAM;  Surgeon: Candyce GORMAN Reek, MD;  Location: [facility] CATH LAB;  Service: Cardiovascular;  Laterality: N/A;   No Known Allergies  Review of Systems: Negative except as noted in the HPI.  Objective:  Dorothy Pena is a pleasant 88 y.o. female in NAD. AAO x 3.  Vascular Examination: Capillary refill time is 3-5 seconds to toes bilateral. Palpable pedal pulses b/l LE. Digital hair present b/l.  Skin temperature gradient WNL b/l. No varicosities b/l. No cyanosis noted b/l.   Dermatological Examination: Pedal skin with normal turgor, texture and tone b/l. No open wounds. No interdigital macerations b/l. Toenails x10 are 3mm thick, discolored, dystrophic with subungual debris. There is pain with compression of the nail plates.  They are elongated x10  Assessment/Plan: 1. Pain due to onychomycosis of toenails of both feet     The mycotic toenails were sharply debrided x10 with sterile nail nippers and a power debriding burr to  decrease bulk/thickness and length.    Return in about 3 months (around 05/31/2024) for [facility].   Dorothy Pena, DPM, FACFAS [facility]     [address] [place], [place] [address]                Office [phone]  Fax [phone]

## 2024-03-24 DIAGNOSIS — E785 Hyperlipidemia, unspecified: Secondary | ICD-10-CM | POA: Diagnosis not present

## 2024-03-24 DIAGNOSIS — I6523 Occlusion and stenosis of bilateral carotid arteries: Secondary | ICD-10-CM | POA: Diagnosis not present

## 2024-03-24 DIAGNOSIS — I10 Essential (primary) hypertension: Secondary | ICD-10-CM | POA: Diagnosis not present

## 2024-04-20 DIAGNOSIS — Z23 Encounter for immunization: Secondary | ICD-10-CM | POA: Diagnosis not present

## 2024-05-31 ENCOUNTER — Ambulatory Visit: Admitting: Podiatry

## 2024-05-31 DIAGNOSIS — M79674 Pain in right toe(s): Secondary | ICD-10-CM

## 2024-05-31 DIAGNOSIS — M79675 Pain in left toe(s): Secondary | ICD-10-CM | POA: Diagnosis not present

## 2024-05-31 DIAGNOSIS — B351 Tinea unguium: Secondary | ICD-10-CM | POA: Diagnosis not present

## 2024-05-31 NOTE — Progress Notes (Signed)
°  °  Subjective:  Patient ID: Dorothy Pena, female    DOB: 1936/03/16,  MRN: [id number]  Dorothy Pena presents to clinic today for:  Chief Complaint  Patient presents with   [facility]    [facility].  Diabetic A1c 7.8 12/13/23. Plavix    Patient notes nails are thick, discolored, elongated and painful in shoegear when trying to ambulate.    Past Medical History:  Diagnosis Date   Diabetes mellitus without complication (HCC)    Hypertension    Intermediate coronary syndrome (HCC)    MI (myocardial infarction) (HCC) 2009   1 stent   Past Surgical History:  Procedure Laterality Date   CAROTID STENT INSERTION     FRACTIONAL FLOW RESERVE WIRE  12/12/2013   Procedure: FRACTIONAL FLOW RESERVE WIRE;  Surgeon: Candyce GORMAN Reek, MD;  Location: [facility] CATH LAB;  Service: Cardiovascular;;   LEFT HEART CATHETERIZATION WITH CORONARY ANGIOGRAM N/A 12/12/2013   Procedure: LEFT HEART CATHETERIZATION WITH CORONARY ANGIOGRAM;  Surgeon: Candyce GORMAN Reek, MD;  Location: [facility] CATH LAB;  Service: Cardiovascular;  Laterality: N/A;   No Known Allergies  Review of Systems: Negative except as noted in the HPI.  Objective:  Dorothy Pena is a pleasant 88 y.o. female in NAD. AAO x 3.  Vascular Examination: Capillary refill time is 3-5 seconds to toes bilateral. Palpable pedal pulses b/l LE. Digital hair present b/l.  Skin temperature gradient WNL b/l. No varicosities b/l. No cyanosis noted b/l.   Dermatological Examination: Pedal skin with normal turgor, texture and tone b/l. No open wounds. No interdigital macerations b/l. Toenails x10 are 3mm thick, discolored, dystrophic with subungual debris. There is pain with compression of the nail plates.  They are elongated x10  Assessment/Plan: 1. Pain due to onychomycosis of toenails of both feet     The mycotic toenails were sharply debrided x10 with sterile nail nippers and a power debriding burr to decrease bulk/thickness and length.    Return in about 3 months (around  08/29/2024) for RFC.   Awanda CHARM Imperial, DPM, FACFAS [facility]     [address] [place], [place] [address]                Office [phone]  Fax [phone]

## 2024-09-27 ENCOUNTER — Ambulatory Visit: Admitting: Podiatry
